# Patient Record
Sex: Male | Born: 1960 | Race: White | Hispanic: No | Marital: Married | Smoking: Never smoker
Health system: Southern US, Community
[De-identification: ages and names within clinical notes are randomized; demographics above are authoritative.]

## PROBLEM LIST (undated history)

## (undated) DIAGNOSIS — E785 Hyperlipidemia, unspecified: Secondary | ICD-10-CM

## (undated) DIAGNOSIS — I10 Essential (primary) hypertension: Secondary | ICD-10-CM

## (undated) DIAGNOSIS — K219 Gastro-esophageal reflux disease without esophagitis: Secondary | ICD-10-CM

## (undated) DIAGNOSIS — T7840XA Allergy, unspecified, initial encounter: Secondary | ICD-10-CM

## (undated) DIAGNOSIS — K429 Umbilical hernia without obstruction or gangrene: Secondary | ICD-10-CM

## (undated) HISTORY — DX: Gastro-esophageal reflux disease without esophagitis: K21.9

## (undated) HISTORY — DX: Essential (primary) hypertension: I10

## (undated) HISTORY — DX: Umbilical hernia without obstruction or gangrene: K42.9

## (undated) HISTORY — DX: Allergy, unspecified, initial encounter: T78.40XA

---

## 1999-12-12 ENCOUNTER — Encounter: Payer: Self-pay | Admitting: Family Medicine

## 1999-12-12 ENCOUNTER — Encounter: Admission: RE | Admit: 1999-12-12 | Discharge: 1999-12-12 | Payer: Self-pay | Admitting: Family Medicine

## 1999-12-22 ENCOUNTER — Encounter: Admission: RE | Admit: 1999-12-22 | Discharge: 2000-01-20 | Payer: Self-pay | Admitting: Family Medicine

## 2002-01-05 HISTORY — PX: HERNIA REPAIR: SHX51

## 2007-05-23 ENCOUNTER — Emergency Department (HOSPITAL_COMMUNITY): Admission: EM | Admit: 2007-05-23 | Discharge: 2007-05-23 | Payer: Self-pay | Admitting: Emergency Medicine

## 2011-03-12 LAB — URINALYSIS, ROUTINE W REFLEX MICROSCOPIC
Leukocytes, UA: NEGATIVE
Specific Gravity, Urine: 1.009
Urobilinogen, UA: 0.2
pH: 7.5

## 2011-03-12 LAB — I-STAT 8, (EC8 V) (CONVERTED LAB)
Acid-Base Excess: 1
BUN: 9
Glucose, Bld: 111 — ABNORMAL HIGH
HCT: 49
Hemoglobin: 16.7
Potassium: 3.6
TCO2: 27

## 2011-03-12 LAB — POCT CARDIAC MARKERS
Myoglobin, poc: 286
Operator id: 288831
Troponin i, poc: <0.05

## 2011-03-12 LAB — CBC
HCT: 43.8
Platelets: 248
RBC: 4.76
RDW: 12.6
WBC: 6.3

## 2011-03-12 LAB — SAMPLE TO BLOOD BANK

## 2011-03-12 LAB — URINE MICROSCOPIC-ADD ON

## 2011-03-12 LAB — POCT I-STAT CREATININE: Operator id: 288831

## 2011-03-12 LAB — DIFFERENTIAL
Lymphocytes Relative: 28
Lymphs Abs: 1.8
Neutro Abs: 4
Neutrophils Relative %: 64

## 2011-05-04 ENCOUNTER — Encounter (INDEPENDENT_AMBULATORY_CARE_PROVIDER_SITE_OTHER): Payer: Self-pay | Admitting: Surgery

## 2011-05-17 ENCOUNTER — Ambulatory Visit (INDEPENDENT_AMBULATORY_CARE_PROVIDER_SITE_OTHER): Payer: Self-pay | Admitting: Surgery

## 2011-06-09 ENCOUNTER — Ambulatory Visit (INDEPENDENT_AMBULATORY_CARE_PROVIDER_SITE_OTHER): Payer: Self-pay | Admitting: Surgery

## 2011-06-29 ENCOUNTER — Encounter (INDEPENDENT_AMBULATORY_CARE_PROVIDER_SITE_OTHER): Payer: Self-pay | Admitting: Surgery

## 2011-06-29 ENCOUNTER — Ambulatory Visit (INDEPENDENT_AMBULATORY_CARE_PROVIDER_SITE_OTHER): Payer: 59 | Admitting: Surgery

## 2011-06-29 VITALS — BP 128/88 | HR 76 | Temp 98.2°F | Resp 18 | Ht 70.0 in | Wt 175.2 lb

## 2011-06-29 DIAGNOSIS — K429 Umbilical hernia without obstruction or gangrene: Secondary | ICD-10-CM | POA: Insufficient documentation

## 2011-06-29 NOTE — Progress Notes (Signed)
Patient ID: Willie [name], male   DOB: 04-03-1961, 51 y.o.   MRN: [id number]  Chief Complaint  Patient presents with  . Other    Eval umbilical hernia    HPI Willie [name] is a 51 y.o. male.   HPIThis is a pleasant gentleman who is referred by Dr. Merla Riches for evaluation of a recurrent umbilical hernia. The patient had an umbilical hernia repair approximately 10 years ago without mesh. He has noticed a recurrence for about a year. He has a bulge which causes no discomfort. He has had no nausea or vomiting or structure symptoms. He is otherwise without complaints.  Past Medical History  Diagnosis Date  . Hypertension   . Umbilical hernia     Past Surgical History  Procedure Date  . Hernia repair 01/2002    Family History  Problem Relation Age of Onset  . Cancer Father     lung  . Pneumonia Father     Social History History  Substance Use Topics  . Smoking status: Never Smoker   . Smokeless tobacco: Never Used  . Alcohol Use: No    Allergies  Allergen Reactions  . Penicillins Hives, Nausea And Vomiting and Swelling    Current Outpatient Prescriptions  Medication Sig Dispense Refill  . amLODipine (NORVASC) 10 MG tablet Take 10 mg by mouth daily.          Review of Systems Review of Systems  Constitutional: Negative for fever, chills and unexpected weight change.  HENT: Negative for hearing loss, congestion, sore throat, trouble swallowing and voice change.   Eyes: Negative for visual disturbance.  Respiratory: Negative for cough and wheezing.   Cardiovascular: Negative for chest pain, palpitations and leg swelling.  Gastrointestinal: Negative for nausea, vomiting, abdominal pain, diarrhea, constipation, blood in stool, abdominal distention, anal bleeding and rectal pain.  Genitourinary: Negative for hematuria and difficulty urinating.  Musculoskeletal: Negative for arthralgias.  Skin: Negative for rash and wound.  Neurological: Negative for seizures, syncope,  weakness and headaches.  Hematological: Negative for adenopathy. Does not bruise/bleed easily.  Psychiatric/Behavioral: Negative for confusion.    Blood pressure 128/88, pulse 76, temperature 98.2 F (36.8 C), temperature source Temporal, resp. rate 18, height 5\' 10"  (1.778 m), weight 175 lb 3.2 oz (79.47 kg).  Physical Exam Physical Exam  Constitutional: He is oriented to person, place, and time. He appears well-developed and well-nourished. No distress.  HENT:  Head: Normocephalic and atraumatic.  Right Ear: External ear normal.  Left Ear: External ear normal.  Nose: Nose normal.  Mouth/Throat: Oropharynx is clear and moist. No oropharyngeal exudate.  Eyes: Conjunctivae and EOM are normal. Pupils are equal, round, and reactive to light. No scleral icterus.  Neck: Normal range of motion. Neck supple. No tracheal deviation present. No thyromegaly present.  Cardiovascular: Normal rate, regular rhythm, normal heart sounds and intact distal pulses.   No murmur heard. Pulmonary/Chest: Effort normal and breath sounds normal. No respiratory distress. He has no wheezes.  Abdominal: Soft. Bowel sounds are normal. He exhibits no distension. There is tenderness. There is no rebound.        He has a well-healed incision at the umbilicus with an easily reducible hernia  Musculoskeletal: Normal range of motion. He exhibits no edema and no tenderness.  Lymphadenopathy:    He has no cervical adenopathy.  Neurological: He is oriented to person, place, and time.  Skin: Skin is warm and dry. No rash noted. No erythema.  Psychiatric: His behavior is normal.  Judgment normal.    Data Reviewed I have the notes from Dr. Merla Riches which I have reviewed  Assessment    Recurrent umbilical hernia    Plan    Repair with mesh was recommended. I discussed both the laparoscopic and open techniques with the patient. I discussed the risk of surgery which includes, but is not limited to bleeding, infection,  need for removal of the mesh had become infected, injury to stranding structures, recurrence, etc. He understands and wishes to proceed with open repair. Likely of success is good. He will best be scheduled for open repair of a recurrent umbilical hernia with mesh       Willie [name] A 06/29/2011, 10:48 AM

## 2011-07-08 ENCOUNTER — Telehealth: Payer: Self-pay

## 2011-07-08 MED ORDER — AMLODIPINE BESYLATE 10 MG PO TABS: 10.0000 mg | ORAL_TABLET | Freq: Every day | ORAL | Status: DC

## 2011-07-08 NOTE — Telephone Encounter (Signed)
Pt would like refill on his bp medication he says he has not refills but it hasent been too long since he has been here

## 2011-07-08 NOTE — Telephone Encounter (Signed)
Pt needs rx refill for norvasc 5mg . Can we refill rx one time only and needs ov?  Last seen in 04/28/11 and labwork last done on 4/12.  Chart is at nurses station if needed.

## 2011-07-08 NOTE — Telephone Encounter (Signed)
Ok to fill x 1. Needs ov for more.  ryan

## 2011-07-09 NOTE — Telephone Encounter (Signed)
Called pt to verify strength of Rx and pharmacy. Called in Norvasc 10 mg. Pt agrees to f/up before next RF.

## 2011-08-27 ENCOUNTER — Encounter (HOSPITAL_COMMUNITY): Payer: Self-pay | Admitting: Pharmacy Technician

## 2011-08-29 ENCOUNTER — Ambulatory Visit (INDEPENDENT_AMBULATORY_CARE_PROVIDER_SITE_OTHER): Payer: 59 | Admitting: Family Medicine

## 2011-08-29 VITALS — BP 158/110 | HR 76 | Resp 16 | Ht 69.5 in | Wt 172.0 lb

## 2011-08-29 DIAGNOSIS — I10 Essential (primary) hypertension: Secondary | ICD-10-CM

## 2011-08-29 MED ORDER — AMLODIPINE BESYLATE 10 MG PO TABS: 10.0000 mg | ORAL_TABLET | Freq: Every day | ORAL | Status: DC

## 2011-08-29 NOTE — Progress Notes (Signed)
  Patient Name: Willie [name] Date of Birth: 1960/10/03 Medical Record Number: [id number] Gender: male Date of Encounter: 08/29/2011  History of Present Illness:  Willie [name] is a 51 y.o. very pleasant male patient who presents with the following:  Needs to have his BP medication refilled- he has been out for about one week and is to have an umbilical hernia repair this week.  We increased his norvasc from 5 to 10mg  last fall, and per his report his BP looked fine at his pre-op consultation.  He is to have labs done tomorrow.  He notes no CP or SOB.  He plays basketball for exercise without a problem  Patient Active Problem List  Diagnoses  . Recurrent umbilical hernia   Past Medical History  Diagnosis Date  . Hypertension   . Umbilical hernia    Past Surgical History  Procedure Date  . Hernia repair 01/2002   History  Substance Use Topics  . Smoking status: Never Smoker   . Smokeless tobacco: Never Used  . Alcohol Use: No   Family History  Problem Relation Age of Onset  . Cancer Father     lung  . Pneumonia Father    Allergies  Allergen Reactions  . Penicillins Hives, Nausea And Vomiting and Swelling    Medication list has been reviewed and updated.  Review of Systems: As per HPI- otherwise negative.  Physical Examination: Filed Vitals:   08/29/11 0853  BP: 158/110  Pulse: 76  Resp: 16  Height: 5' 9.5" (1.765 m)  Weight: 172 lb (78.019 kg)    Body mass index is 25.04 kg/(m^2).  GEN: WDWN, NAD, Non-toxic, A & O x 3 HEENT: Atraumatic, Normocephalic. Neck supple. No masses, No LAD. Ears and Nose: No external deformity. CV: RRR, No M/G/R. No JVD. No thrill. No extra heart sounds. PULM: CTA B, no wheezes, crackles, rhonchi. No retractions. No resp. distress. No accessory muscle use. EXTR: No c/c/e NEURO Normal gait.  PSYCH: Normally interactive. Conversant. Not depressed or anxious appearing.  Calm demeanor.    Assessment and Plan: 1. HTN (hypertension)   amLODipine (NORVASC) 10 MG tablet   Refilled BP meds and asked him to please send [place] a copy of his pre-op labs as he is too busy to have labs done today.  Follow- up for HTN maintenance as usual

## 2011-08-30 ENCOUNTER — Ambulatory Visit (HOSPITAL_COMMUNITY)
Admission: RE | Admit: 2011-08-30 | Discharge: 2011-08-30 | Disposition: A | Discharge status: Home / Self Care | Payer: 59 | Source: Ambulatory Visit | Attending: Surgery | Admitting: Surgery

## 2011-08-30 ENCOUNTER — Encounter (HOSPITAL_COMMUNITY)
Admission: RE | Admit: 2011-08-30 | Discharge: 2011-08-30 | Disposition: A | Discharge status: Home / Self Care | Payer: 59 | Source: Ambulatory Visit | Attending: Surgery | Admitting: Surgery

## 2011-08-30 ENCOUNTER — Encounter (HOSPITAL_COMMUNITY): Payer: Self-pay

## 2011-08-30 ENCOUNTER — Other Ambulatory Visit: Payer: Self-pay

## 2011-08-30 DIAGNOSIS — Z01818 Encounter for other preprocedural examination: Secondary | ICD-10-CM | POA: Insufficient documentation

## 2011-08-30 LAB — CBC
HCT: 46.4 % (ref 39.0–52.0)
Hemoglobin: 16 g/dL (ref 13.0–17.0)
MCH: 31.7 pg (ref 26.0–34.0)
Platelets: 269 10*3/uL (ref 150–400)
RBC: 5.05 MIL/uL (ref 4.22–5.81)
WBC: 6.2 10*3/uL (ref 4.0–10.5)

## 2011-08-30 LAB — BASIC METABOLIC PANEL
Creatinine, Ser: 1.2 mg/dL (ref 0.50–1.35)
Glucose, Bld: 118 mg/dL — ABNORMAL HIGH (ref 70–99)
Potassium: 4.7 mEq/L (ref 3.5–5.1)
Sodium: 139 mEq/L (ref 135–145)

## 2011-08-30 LAB — SURGICAL PCR SCREEN: MRSA, PCR: NEGATIVE

## 2011-08-30 NOTE — Pre-Procedure Instructions (Signed)
[address]  08/30/2011   Your procedure is scheduled on:  09/01/11  Report to [facility] at 630 AM.  Call this number if you have problems the morning of surgery: [phone]   Remember:   Do not eat food:After Midnight.  May have clear liquids: up to 4 Hours before arrival.  Clear liquids include soda, tea, black coffee, apple or grape juice, broth.  Take these medicines the morning of surgery with A SIP OF WATER: norvasc   Do not wear jewelry, make-up or nail polish.  Do not wear lotions, powders, or perfumes. You may wear deodorant.  Do not shave 48 hours prior to surgery.  Do not bring valuables to the hospital.  Contacts, dentures or bridgework may not be worn into surgery.  Leave suitcase in the car. After surgery it may be brought to your room.  For patients admitted to the hospital, checkout time is 11:00 AM the day of discharge.   Patients discharged the day of surgery will not be allowed to drive home.  Name and phone number of your driver: family  Special Instructions: CHG Shower Use Special Wash: 1/2 bottle night before surgery and 1/2 bottle morning of surgery.   Please read over the following fact sheets that you were given: Pain Booklet, Coughing and Deep Breathing, MRSA Information and Surgical Site Infection Prevention

## 2011-08-30 NOTE — Progress Notes (Signed)
Pt stated there have been no cardiac tests done by pcp.

## 2011-08-31 MED ORDER — CIPROFLOXACIN IN D5W 400 MG/200ML IV SOLN
400.0000 mg | INTRAVENOUS | Status: AC
  Administered 2011-09-01: 400 mg via INTRAVENOUS
  Filled 2011-08-31: qty 200

## 2011-08-31 NOTE — H&P (Signed)
HPI  Willie [name] is a 51 y.o. male.  HPIThis is a pleasant gentleman who is referred by Dr. Merla Riches for evaluation of a recurrent umbilical hernia. The patient had an umbilical hernia repair approximately 10 years ago without mesh. He has noticed a recurrence for about a year. He has a bulge which causes no discomfort. He has had no nausea or vomiting or structure symptoms. He is otherwise without complaints.  Past Medical History   Diagnosis  Date   .  Hypertension    .  Umbilical hernia     Past Surgical History   Procedure  Date   .  Hernia repair  01/2002    Family History   Problem  Relation  Age of Onset   .  Cancer  Father       lung    .  Pneumonia  Father     Social History  History   Substance Use Topics   .  Smoking status:  Never Smoker   .  Smokeless tobacco:  Never Used   .  Alcohol Use:  No    Allergies   Allergen  Reactions   .  Penicillins  Hives, Nausea And Vomiting and Swelling    Current Outpatient Prescriptions   Medication  Sig  Dispense  Refill   .  amLODipine (NORVASC) 10 MG tablet  Take 10 mg by mouth daily.      Review of Systems  Review of Systems  Constitutional: Negative for fever, chills and unexpected weight change.  HENT: Negative for hearing loss, congestion, sore throat, trouble swallowing and voice change.  Eyes: Negative for visual disturbance.  Respiratory: Negative for cough and wheezing.  Cardiovascular: Negative for chest pain, palpitations and leg swelling.  Gastrointestinal: Negative for nausea, vomiting, abdominal pain, diarrhea, constipation, blood in stool, abdominal distention, anal bleeding and rectal pain.  Genitourinary: Negative for hematuria and difficulty urinating.  Musculoskeletal: Negative for arthralgias.  Skin: Negative for rash and wound.  Neurological: Negative for seizures, syncope, weakness and headaches.  Hematological: Negative for adenopathy. Does not bruise/bleed easily.  Psychiatric/Behavioral:  Negative for confusion.   Blood pressure 128/88, pulse 76, temperature 98.2 F (36.8 C), temperature source Temporal, resp. rate 18, height 5\' 10"  (1.778 m), weight 175 lb 3.2 oz (79.47 kg).  Physical Exam  Physical Exam  Constitutional: He is oriented to person, place, and time. He appears well-developed and well-nourished. No distress.  HENT:  Head: Normocephalic and atraumatic.  Right Ear: External ear normal.  Left Ear: External ear normal.  Nose: Nose normal.  Mouth/Throat: Oropharynx is clear and moist. No oropharyngeal exudate.  Eyes: Conjunctivae and EOM are normal. Pupils are equal, round, and reactive to light. No scleral icterus.  Neck: Normal range of motion. Neck supple. No tracheal deviation present. No thyromegaly present.  Cardiovascular: Normal rate, regular rhythm, normal heart sounds and intact distal pulses.  No murmur heard.  Pulmonary/Chest: Effort normal and breath sounds normal. No respiratory distress. He has no wheezes.  Abdominal: Soft. Bowel sounds are normal. He exhibits no distension. There is tenderness. There is no rebound.  He has a well-healed incision at the umbilicus with an easily reducible hernia  Musculoskeletal: Normal range of motion. He exhibits no edema and no tenderness.  Lymphadenopathy:  He has no cervical adenopathy.  Neurological: He is oriented to person, place, and time.  Skin: Skin is warm and dry. No rash noted. No erythema.  Psychiatric: His behavior is normal. Judgment normal.  Data Reviewed  I have the notes from Dr. Merla Riches which I have reviewed  Assessment   Recurrent umbilical hernia   Plan   Repair with mesh was recommended. I discussed both the laparoscopic and open techniques with the patient. I discussed the risk of surgery which includes, but is not limited to bleeding, infection, need for removal of the mesh had become infected, injury to stranding structures, recurrence, etc. He understands and wishes to proceed  with open repair. Likely of success is good. He will best be scheduled for open repair of a recurrent umbilical hernia with mesh   Nabila Albarracin A

## 2011-09-01 ENCOUNTER — Encounter (HOSPITAL_COMMUNITY): Payer: Self-pay | Admitting: *Deleted

## 2011-09-01 ENCOUNTER — Encounter (HOSPITAL_COMMUNITY)
Admission: RE | Disposition: A | Discharge status: Home / Self Care | Payer: Self-pay | Source: Ambulatory Visit | Attending: Surgery

## 2011-09-01 ENCOUNTER — Ambulatory Visit (HOSPITAL_COMMUNITY)
Admission: RE | Admit: 2011-09-01 | Discharge: 2011-09-01 | Disposition: A | Discharge status: Home / Self Care | Payer: 59 | Source: Ambulatory Visit | Attending: Surgery | Admitting: Surgery

## 2011-09-01 ENCOUNTER — Ambulatory Visit (HOSPITAL_COMMUNITY): Payer: 59 | Admitting: *Deleted

## 2011-09-01 DIAGNOSIS — I10 Essential (primary) hypertension: Secondary | ICD-10-CM | POA: Insufficient documentation

## 2011-09-01 DIAGNOSIS — Z01810 Encounter for preprocedural cardiovascular examination: Secondary | ICD-10-CM | POA: Insufficient documentation

## 2011-09-01 DIAGNOSIS — K429 Umbilical hernia without obstruction or gangrene: Secondary | ICD-10-CM | POA: Insufficient documentation

## 2011-09-01 DIAGNOSIS — K432 Incisional hernia without obstruction or gangrene: Secondary | ICD-10-CM

## 2011-09-01 HISTORY — PX: UMBILICAL HERNIA REPAIR: SHX196

## 2011-09-01 SURGERY — REPAIR, HERNIA, UMBILICAL, ADULT
Anesthesia: General | Site: Abdomen | Wound class: Clean

## 2011-09-01 MED ORDER — HYDROCODONE-ACETAMINOPHEN 5-325 MG PO TABS: 1.0000 | ORAL_TABLET | ORAL | Status: AC | PRN

## 2011-09-01 MED ORDER — KETOROLAC TROMETHAMINE 60 MG/2ML IM SOLN
INTRAMUSCULAR | Status: DC | PRN
  Administered 2011-09-01: 30 mg via INTRAMUSCULAR

## 2011-09-01 MED ORDER — BUPIVACAINE-EPINEPHRINE PF 0.5-1:200000 % IJ SOLN
INTRAMUSCULAR | Status: DC | PRN
  Administered 2011-09-01: 16 mL

## 2011-09-01 MED ORDER — PROPOFOL 10 MG/ML IV EMUL
INTRAVENOUS | Status: DC | PRN
  Administered 2011-09-01: 200 mg via INTRAVENOUS

## 2011-09-01 MED ORDER — HYDROMORPHONE HCL PF 1 MG/ML IJ SOLN
0.2500 mg | INTRAMUSCULAR | Status: DC | PRN
  Administered 2011-09-01: 0.5 mg via INTRAVENOUS

## 2011-09-01 MED ORDER — FENTANYL CITRATE 0.05 MG/ML IJ SOLN
INTRAMUSCULAR | Status: DC | PRN
  Administered 2011-09-01 (×2): 50 ug via INTRAVENOUS
  Administered 2011-09-01: 25 ug via INTRAVENOUS

## 2011-09-01 MED ORDER — LACTATED RINGERS IV SOLN
INTRAVENOUS | Status: DC | PRN
  Administered 2011-09-01 (×2): via INTRAVENOUS

## 2011-09-01 MED ORDER — MORPHINE SULFATE 2 MG/ML IJ SOLN: 0.0500 mg/kg | INTRAMUSCULAR | Status: DC | PRN

## 2011-09-01 MED ORDER — BUPIVACAINE HCL 0.5 % IJ SOLN: INTRAMUSCULAR | Status: DC | PRN

## 2011-09-01 MED ORDER — LIDOCAINE HCL (CARDIAC) 20 MG/ML IV SOLN
INTRAVENOUS | Status: DC | PRN
  Administered 2011-09-01: 100 mg via INTRAVENOUS

## 2011-09-01 MED ORDER — ONDANSETRON HCL 4 MG/2ML IJ SOLN: 4.0000 mg | Freq: Once | INTRAMUSCULAR | Status: DC | PRN

## 2011-09-01 MED ORDER — 0.9 % SODIUM CHLORIDE (POUR BTL) OPTIME
TOPICAL | Status: DC | PRN
  Administered 2011-09-01: 1000 mL

## 2011-09-01 MED ORDER — ONDANSETRON HCL 4 MG/2ML IJ SOLN
INTRAMUSCULAR | Status: DC | PRN
  Administered 2011-09-01: 4 mg via INTRAVENOUS

## 2011-09-01 MED ORDER — MIDAZOLAM HCL 5 MG/5ML IJ SOLN
INTRAMUSCULAR | Status: DC | PRN
  Administered 2011-09-01: 2 mg via INTRAVENOUS

## 2011-09-01 SURGICAL SUPPLY — 38 items
APL SKNCLS STERI-STRIP NONHPOA (GAUZE/BANDAGES/DRESSINGS) ×1
BENZOIN TINCTURE PRP APPL 2/3 (GAUZE/BANDAGES/DRESSINGS) ×2 IMPLANT
BLADE SURG ROTATE 9660 (MISCELLANEOUS) ×1 IMPLANT
CANISTER SUCTION 2500CC (MISCELLANEOUS) ×1 IMPLANT
CHLORAPREP W/TINT 26ML (MISCELLANEOUS) ×2 IMPLANT
CLOTH BEACON ORANGE TIMEOUT ST (SAFETY) ×2 IMPLANT
COVER SURGICAL LIGHT HANDLE (MISCELLANEOUS) ×2 IMPLANT
DECANTER SPIKE VIAL GLASS SM (MISCELLANEOUS) IMPLANT
DRAPE PED LAPAROTOMY (DRAPES) ×2 IMPLANT
DRSG TEGADERM 4X4.75 (GAUZE/BANDAGES/DRESSINGS) ×2 IMPLANT
ELECT REM PT RETURN 9FT ADLT (ELECTROSURGICAL) ×2
ELECTRODE REM PT RTRN 9FT ADLT (ELECTROSURGICAL) ×1 IMPLANT
GAUZE SPONGE 2X2 8PLY STRL LF (GAUZE/BANDAGES/DRESSINGS) ×1 IMPLANT
GLOVE BIO SURGEON STRL SZ7 (GLOVE) ×1 IMPLANT
GLOVE BIOGEL PI IND STRL 7.0 (GLOVE) IMPLANT
GLOVE BIOGEL PI INDICATOR 7.0 (GLOVE) ×2
GLOVE SURG SIGNA 7.5 PF LTX (GLOVE) ×2 IMPLANT
GLOVE SURG SS PI 6.5 STRL IVOR (GLOVE) ×1 IMPLANT
GLOVE SURG SS PI 7.0 STRL IVOR (GLOVE) ×1 IMPLANT
GOWN PREVENTION PLUS XLARGE (GOWN DISPOSABLE) ×2 IMPLANT
GOWN STRL NON-REIN LRG LVL3 (GOWN DISPOSABLE) ×3 IMPLANT
KIT BASIN OR (CUSTOM PROCEDURE TRAY) ×2 IMPLANT
KIT ROOM TURNOVER OR (KITS) ×2 IMPLANT
MESH VENTRALEX ST 1-7/10 CRC S (Mesh General) ×1 IMPLANT
NDL HYPO 25GX1X1/2 BEV (NEEDLE) ×1 IMPLANT
NEEDLE HYPO 25GX1X1/2 BEV (NEEDLE) ×2 IMPLANT
NS IRRIG 1000ML POUR BTL (IV SOLUTION) ×2 IMPLANT
PACK GENERAL/GYN (CUSTOM PROCEDURE TRAY) ×2 IMPLANT
PAD ARMBOARD 7.5X6 YLW CONV (MISCELLANEOUS) ×2 IMPLANT
SPONGE GAUZE 2X2 STER 10/PKG (GAUZE/BANDAGES/DRESSINGS) ×1
STRIP CLOSURE SKIN 1/2X4 (GAUZE/BANDAGES/DRESSINGS) ×2 IMPLANT
SUT MNCRL AB 4-0 PS2 18 (SUTURE) ×2 IMPLANT
SUT NOVA NAB DX-16 0-1 5-0 T12 (SUTURE) ×3 IMPLANT
SUT VIC AB 3-0 SH 27 (SUTURE) ×2
SUT VIC AB 3-0 SH 27X BRD (SUTURE) ×1 IMPLANT
SYR CONTROL 10ML LL (SYRINGE) ×2 IMPLANT
TOWEL OR 17X24 6PK STRL BLUE (TOWEL DISPOSABLE) ×2 IMPLANT
TOWEL OR 17X26 10 PK STRL BLUE (TOWEL DISPOSABLE) ×2 IMPLANT

## 2011-09-01 NOTE — Op Note (Signed)
HERNIA REPAIR UMBILICAL ADULT, INSERTION OF MESH  Procedure Note  Willie [name] 09/01/2011   Pre-op Diagnosis: recurrent umbilical hernia     Post-op Diagnosis: same  Procedure(s): HERNIA REPAIR UMBILICAL ADULT INSERTION OF MESH (4.3cm round bard patch)  Surgeon(s): Shelly Rubenstein, MD  Anesthesia: General  Staff:  Eppie Gibson, RN - Circulator Assistant Leighton Parody - Scrub Person Gerre Pebbles Sipsis, RN - Circulator Eppie Gibson, RN - Scrub Person  Estimated Blood Loss: Minimal               Indications: This is [name] 51 year old gentleman who had an umbilical hernia repaired primarily approximately 10 years ago. He has now recurred. The decision has been made to proceed to the operating room for repair with mesh.  Procedure: The patient was brought to the operating room and identified as the correct patient. He was placed supine on the operating table and general anesthesia was induced. His abdomen was then prepped and draped in the usual sterile fashion. The skin of the lower edge of the umbilicus was anesthetized with Marcaine with epinephrine. I made an infraumbilical transverse incision with scalpel. After this down to the hernia which was easily identified. There was [name] small amount of omentum stuck to the fascial edges. I was able to free this up circumferentially with electrocautery and reduce all the omentum back into the abdominal cavity. I then brought [name] 4.3 cm Bard umbilical patch onto the field. I placed this through the fascial defect and then pulled up taut against the peritoneal surface with the straps. I then sewed the mesh in place with interrupted #1 Novafil sutures. I was then able to close the fascia over the top of the mesh with interrupted #1 Novafil sutures as well. Good closure and coverage of the fascial defect appeared to be achieved. I then anesthetized the fascia further with Marcaine. I imbricated the umbilical skin with 3-0 Vicryl  sutures. I then closed subcutaneous tissue with interrupted 3-0 Vicryl sutures and closed the skin with [name] running 4-0 Monocryl. Steri-Strips, gauze, and Tegaderm were then applied. The patient tolerated the procedure well. All counts were correct at the end of the procedure. The patient was then extubated in the operating room and taken in [name] stable condition to the recovery room.          Willie [name]   Date: 09/01/2011  Time: 9:11 AM

## 2011-09-01 NOTE — Discharge Instructions (Signed)
[facility] _______[facility]  UMBILICAL OR INGUINAL HERNIA REPAIR: POST OP INSTRUCTIONS  Always review your discharge instruction sheet given to you by the facility where your surgery was performed. IF YOU HAVE DISABILITY OR FAMILY LEAVE FORMS, YOU MUST BRING THEM TO THE OFFICE FOR PROCESSING.   DO NOT GIVE THEM TO YOUR DOCTOR.  1. A  prescription for pain medication may be given to you upon discharge.  Take your pain medication as prescribed, if needed.  If narcotic pain medicine is not needed, then you may take acetaminophen (Tylenol) or ibuprofen (Advil) as needed. 2. Take your usually prescribed medications unless otherwise directed. 3. If you need a refill on your pain medication, please contact your pharmacy.  They will contact our office to request authorization. Prescriptions will not be filled after 5 pm or on week-ends. 4. You should follow a light diet the first 24 hours after arrival home, such as soup and crackers, etc.  Be sure to include lots of fluids daily.  Resume your normal diet the day after surgery. 5. Most patients will experience some swelling and bruising around the umbilicus or in the groin and scrotum.  Ice packs and reclining will help.  Swelling and bruising can take several days to resolve.  6. It is common to experience some constipation if taking pain medication after surgery.  Increasing fluid intake and taking a stool softener (such as Colace) will usually help or prevent this problem from occurring.  A mild laxative (Milk of Magnesia or Miralax) should be taken according to package directions if there are no bowel movements after 48 hours. 7. Unless discharge instructions indicate otherwise, you may remove your bandages 24-48 hours after surgery, and you may shower at that time.  You may have steri-strips (small skin tapes) in place directly over the incision.  These strips should be left on the skin for 7-10 days.  If your surgeon used skin glue on the  incision, you may shower in 24 hours.  The glue will flake off over the next 2-3 weeks.  Any sutures or staples will be removed at the office during your follow-up visit. 8. ACTIVITIES:  You may resume regular (light) daily activities beginning the next day--such as daily self-care, walking, climbing stairs--gradually increasing activities as tolerated.  You may have sexual intercourse when it is comfortable.  Refrain from any heavy lifting or straining until approved by your doctor. a. You may drive when you are no longer taking prescription pain medication, you can comfortably wear a seatbelt, and you can safely maneuver your car and apply brakes. b. RETURN TO WORK:  __________________________________________________________ 9. You should see your doctor in the office for a follow-up appointment approximately 2-3 weeks after your surgery.  Make sure that you call for this appointment within a day or two after you arrive home to insure a convenient appointment time. 10. OTHER INSTRUCTIONS:  __________________________________________________________________________________________________________________________________________________________________________________________  WHEN TO CALL YOUR DOCTOR: 1. Fever over 101.0 2. Inability to urinate 3. Nausea and/or vomiting 4. Extreme swelling or bruising 5. Continued bleeding from incision. 6. Increased pain, redness, or drainage from the incision  The clinic staff is available to answer your questions during regular business hours.  Please don't hesitate to call and ask to speak to one of the nurses for clinical concerns.  If you have a medical emergency, go to the nearest emergency room or call 911.  A surgeon from [facility] is always on call at the hospital     [address], [address], [place], [place]  [address] ?  [address] [address], , [place]   [address] [phone] ? [phone] ? FAX [phone] Web site:  [email or web address]  

## 2011-09-01 NOTE — Anesthesia Postprocedure Evaluation (Signed)
  Anesthesia Post-op Note  Patient: Willie [name]  Procedure(s) Performed: Procedure(s) (LRB): HERNIA REPAIR UMBILICAL ADULT (N/A) INSERTION OF MESH (N/A)  Patient Location: PACU  Anesthesia Type: General  Level of Consciousness: awake, alert  and oriented  Airway and Oxygen Therapy: Patient Spontanous Breathing and Patient connected to nasal cannula oxygen  Post-op Pain: mild  Post-op Assessment: Post-op Vital signs reviewed, Patient's Cardiovascular Status Stable, Respiratory Function Stable, Patent Airway, No signs of Nausea or vomiting and Pain level controlled  Post-op Vital Signs: Reviewed and stable  Complications: No apparent anesthesia complications

## 2011-09-01 NOTE — Anesthesia Procedure Notes (Signed)
Procedure Name: LMA Insertion Date/Time: 09/01/2011 8:35 AM Performed by: Malachi Pro Pre-anesthesia Checklist: Patient identified, Emergency Drugs available, Suction available, Patient being monitored and Timeout performed Patient Re-evaluated:Patient Re-evaluated prior to inductionOxygen Delivery Method: Circle system utilized Preoxygenation: Pre-oxygenation with 100% oxygen Intubation Type: IV induction LMA: LMA inserted and LMA with gastric port inserted LMA Size: 5.0 Number of attempts: 2 Placement Confirmation: positive ETCO2 Tube secured with: Tape Dental Injury: Teeth and Oropharynx as per pre-operative assessment

## 2011-09-01 NOTE — Transfer of Care (Signed)
Immediate Anesthesia Transfer of Care Note  Patient: Willie [name]  Procedure(s) Performed: Procedure(s) (LRB): HERNIA REPAIR UMBILICAL ADULT (N/A) INSERTION OF MESH (N/A)  Patient Location: PACU  Anesthesia Type: General  Level of Consciousness: awake, alert  and patient cooperative  Airway & Oxygen Therapy: Patient Spontanous Breathing and Patient connected to face mask oxygen  Post-op Assessment: Report given to PACU RN, Post -op Vital signs reviewed and stable and Patient moving all extremities  Post vital signs: Reviewed and stable  Complications: No apparent anesthesia complications

## 2011-09-01 NOTE — Anesthesia Preprocedure Evaluation (Addendum)
Anesthesia Evaluation  Patient identified by MRN, date of birth, ID band Patient awake    Reviewed: Allergy & Precautions, H&P , Patient's Chart, lab work & pertinent test results  Airway Mallampati: II TM Distance: >3 FB Neck ROM: Full    Dental  (+) Dental Advisory Given and Teeth Intact   Pulmonary  breath sounds clear to auscultation        Cardiovascular hypertension, Pt. on medications Rhythm:Regular Rate:Normal     Neuro/Psych    GI/Hepatic   Endo/Other    Renal/GU      Musculoskeletal   Abdominal   Peds  Hematology   Anesthesia Other Findings   Reproductive/Obstetrics                         Anesthesia Physical Anesthesia Plan  ASA: II  Anesthesia Plan: General   Post-op Pain Management:    Induction: Intravenous  Airway Management Planned: Oral ETT  Additional Equipment:   Intra-op Plan:   Post-operative Plan: Extubation in OR  Informed Consent: I have reviewed the patients History and Physical, chart, labs and discussed the procedure including the risks, benefits and alternatives for the proposed anesthesia with the patient or authorized representative who has indicated his/her understanding and acceptance.   Dental advisory given  Plan Discussed with: Anesthesiologist  Anesthesia Plan Comments:         Anesthesia Quick Evaluation

## 2011-09-01 NOTE — Interval H&P Note (Signed)
History and Physical Interval Note: he has had no change in his history or exam  09/01/2011 7:47 AM  Willie [name]  has presented today for surgery, with the diagnosis of umbilical hernia  The various methods of treatment have been discussed with the patient and family. After consideration of risks, benefits and other options for treatment, the patient has consented to  Procedure(s) (LRB): HERNIA REPAIR UMBILICAL ADULT (N/A) INSERTION OF MESH (N/A) as a surgical intervention .  The patients' history has been reviewed, patient examined, no change in status, stable for surgery.  I have reviewed the patients' chart and labs.  Questions were answered to the patient's satisfaction.     Cadi Rhinehart A

## 2011-09-01 NOTE — Preoperative (Signed)
Beta Blockers   Reason not to administer Beta Blockers:Not Applicable 

## 2011-09-02 ENCOUNTER — Encounter (HOSPITAL_COMMUNITY): Payer: Self-pay | Admitting: Surgery

## 2011-09-02 ENCOUNTER — Telehealth (INDEPENDENT_AMBULATORY_CARE_PROVIDER_SITE_OTHER): Payer: Self-pay | Admitting: General Surgery

## 2011-09-02 NOTE — Telephone Encounter (Signed)
Pt calling in following umbilical hernia repair.  He wonders when he can return to work?  With or without restrictions? He will have a name and FAX number to provide with callback; I implied he would not hear from [place] until next week about RTW.  He also asked about something to help with BM.  Suggested Miralax.

## 2011-09-16 ENCOUNTER — Encounter (INDEPENDENT_AMBULATORY_CARE_PROVIDER_SITE_OTHER): Payer: Self-pay | Admitting: Surgery

## 2011-09-16 ENCOUNTER — Ambulatory Visit (INDEPENDENT_AMBULATORY_CARE_PROVIDER_SITE_OTHER): Payer: 59 | Admitting: Surgery

## 2011-09-16 VITALS — BP 128/88 | HR 96 | Temp 98.6°F | Ht 70.0 in | Wt 172.4 lb

## 2011-09-16 DIAGNOSIS — Z09 Encounter for follow-up examination after completed treatment for conditions other than malignant neoplasm: Secondary | ICD-10-CM

## 2011-09-16 NOTE — Progress Notes (Signed)
Subjective:     Patient ID: Willie [name], male   DOB: 1960/10/10, 51 y.o.   MRN: [id number]  HPI  He is here for his first postop visit status post repair of a recurrent hernia at umbilicus with mesh. He is doing well has no complaints Review of Systems     Objective:   Physical Exam His incision is well-healed. There is no evidence of infection. There is no evidence of recurrence    Assessment:     Patient status post umbilical hernia repair doing well    Plan:     He may return to normal activity. I will see him back as needed

## 2012-09-11 ENCOUNTER — Other Ambulatory Visit: Payer: Self-pay | Admitting: Family Medicine

## 2013-03-09 ENCOUNTER — Ambulatory Visit: Payer: Self-pay | Admitting: Internal Medicine

## 2013-03-09 VITALS — BP 142/92 | HR 92 | Temp 98.0°F | Resp 20 | Ht 69.75 in | Wt 163.6 lb

## 2013-03-09 DIAGNOSIS — R7309 Other abnormal glucose: Secondary | ICD-10-CM

## 2013-03-09 DIAGNOSIS — I10 Essential (primary) hypertension: Secondary | ICD-10-CM

## 2013-03-09 MED ORDER — AMLODIPINE BESYLATE 10 MG PO TABS: 10.0000 mg | ORAL_TABLET | Freq: Every day | ORAL | Status: DC

## 2013-03-09 NOTE — Progress Notes (Signed)
  Subjective:    Patient ID: Willie [name], male    DOB: 11-19-1960, 52 y.o.   MRN: [id number]  HPI Patient presents today for refill of his BP medication. He has not had any medication for at least a month. Has been really busy. Mother passed away from CHF. 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  Lost his job. Not happy in previous job, glad to be moving on.  No SOB, no CP, no cough.  Occasional acid indigestion, usually brought on by fried foods and garlic. Takes tums for relief.  Plays pick up basket ball a couple of times a week and walks with wife every evening for at least 45 mins.  Emotionally doing well, no sadness, no anger, no depression. Sleeping well.  Review of Systems     Objective:   Physical Exam  Constitutional: He is oriented to person, place, and time. He appears well-developed and well-nourished.  Eyes: Conjunctivae are normal.  Neck: Normal range of motion. Neck supple.  Cardiovascular: Normal rate and regular rhythm.   Pulmonary/Chest: Effort normal and breath sounds normal.  Musculoskeletal: He exhibits no edema.  Neurological: He is alert and oriented to person, place, and time. He has normal reflexes.  Skin: Skin is warm and dry.  Psychiatric: He has a normal mood and affect. His behavior is normal. Judgment and thought content normal.   Results for orders placed in visit on 03/09/13  POCT GLYCOSYLATED HEMOGLOBIN (HGB A1C)      Result Value Range   Hemoglobin A1C 5.5          Assessment & Plan:  Elevated random blood glucose level - Plan: POCT glycosylated hemoglobin (Hb A1C)  HTN (hypertension) - Plan: amLODipine (NORVASC) 10 MG tablet  Discussed HbA1C result  Provided information about the [facility] care clinic while patient uninsured.   DG/RPD

## 2013-03-09 NOTE — Patient Instructions (Signed)
While without health insurance, can try other resources, such as the [facility] clinic for screening and physical exam.

## 2014-06-20 ENCOUNTER — Ambulatory Visit (INDEPENDENT_AMBULATORY_CARE_PROVIDER_SITE_OTHER): Payer: Managed Care, Other (non HMO) | Admitting: Family Medicine

## 2014-06-20 ENCOUNTER — Encounter: Payer: Self-pay | Admitting: Family Medicine

## 2014-06-20 VITALS — BP 160/110 | HR 98 | Temp 98.6°F | Resp 18 | Ht 69.75 in | Wt 169.0 lb

## 2014-06-20 DIAGNOSIS — I10 Essential (primary) hypertension: Secondary | ICD-10-CM

## 2014-06-20 LAB — LIPID PANEL
CHOL/HDL RATIO: 4.9 ratio
Cholesterol: 170 mg/dL (ref 0–200)
HDL: 35 mg/dL — ABNORMAL LOW (ref >39)
LDL Cholesterol: 66 mg/dL (ref 0–99)
Triglycerides: 343 mg/dL — ABNORMAL HIGH (ref <150)
VLDL: 69 mg/dL — ABNORMAL HIGH (ref 0–40)

## 2014-06-20 LAB — COMPLETE METABOLIC PANEL WITH GFR
ALT: 50 U/L (ref 0–53)
AST: 33 U/L (ref 0–37)
Albumin: 4 g/dL (ref 3.5–5.2)
Alkaline Phosphatase: 81 U/L (ref 39–117)
BILIRUBIN TOTAL: 0.5 mg/dL (ref 0.2–1.2)
BUN: 9 mg/dL (ref 6–23)
CO2: 29 mEq/L (ref 19–32)
CREATININE: 1.15 mg/dL (ref 0.50–1.35)
Calcium: 9.5 mg/dL (ref 8.4–10.5)
Chloride: 100 mEq/L (ref 96–112)
GFR, EST AFRICAN AMERICAN: 84 mL/min
GFR, EST NON AFRICAN AMERICAN: 73 mL/min
Glucose, Bld: 98 mg/dL (ref 70–99)
POTASSIUM: 4 meq/L (ref 3.5–5.3)
Sodium: 138 mEq/L (ref 135–145)
Total Protein: 7.5 g/dL (ref 6.0–8.3)

## 2014-06-20 MED ORDER — HYDROCHLOROTHIAZIDE 12.5 MG PO TABS: 12.5000 mg | ORAL_TABLET | Freq: Every day | ORAL | Status: DC

## 2014-06-20 MED ORDER — AMLODIPINE BESYLATE 10 MG PO TABS: 10.0000 mg | ORAL_TABLET | Freq: Every day | ORAL | Status: DC

## 2014-06-20 NOTE — Patient Instructions (Signed)
Take the amlodipine 1 daily  Monitor your blood pressure. If diastolic pressure is continued to rise 54-year-old above didn't get the hydrochlorothiazide prescription filled and take one each morning.  Plan to return in 3-4 months for a follow-up visit. Sooner if problems.

## 2014-06-20 NOTE — Progress Notes (Signed)
Subjective: 54 year old man who has a history of hypertension. He had been on amlodipine 10 mg daily. He let the prescription lapse. Even while he was taking it regularly, he reports that the diastolic blood pressure often ran a little over 90. He has been healthy otherwise. No chest pains or palpitations or excessive shortness of breath. Mild headache from a respiratory infection. Married. 5 children. Horticulturist, commercialGraphic artist. Meredeth IdeWalks a half hour or more daily with his wife.  Objective: Healthy-appearing man in no major distress. TMs normal. Eyes PERRLA. Throat clear. Neck supple without nodes. Chest clear. Heart regular without murmurs. Abdomen soft. Tiny recurrent umbilical hernia.  Assessment: Hypertension, unsatisfactory control  Plan: Amlodipine 10 mg daily  He is to monitor his blood pressure at home. If he gets a pattern of running the diastolic borderline or high he will go ahead and and the prescription for hydrochlorothiazide 12.5 mg daily. Then have him plan to return in about April or May for follow-up visit. Thank you

## 2014-11-12 ENCOUNTER — Ambulatory Visit (INDEPENDENT_AMBULATORY_CARE_PROVIDER_SITE_OTHER): Payer: Managed Care, Other (non HMO) | Admitting: Internal Medicine

## 2014-11-12 VITALS — BP 142/90 | HR 106 | Temp 98.9°F | Resp 18 | Ht 71.0 in | Wt 168.0 lb

## 2014-11-12 DIAGNOSIS — J0100 Acute maxillary sinusitis, unspecified: Secondary | ICD-10-CM

## 2014-11-12 MED ORDER — AZITHROMYCIN 250 MG PO TABS: ORAL_TABLET | ORAL | Status: DC

## 2014-11-12 NOTE — Progress Notes (Addendum)
   Subjective:    Patient ID: Renard MatterGeorge M Rampey, male    DOB: 01/15/1961, 54 y.o.   MRN: [id number] This chart was scribed for Ellamae Siaobert Doolittle, MD by Jolene Provostobert Halas, Medical Scribe. This patient was seen in Room 3 and the patient's care was started a 3:11 PM.  Chief Complaint  Patient presents with  . head congestion    since friday   . Nasal Congestion  . Fatigue    HPI HPI Comments: Renard MatterGeorge M Rought is a 54 y.o. male who [facility] complaining of sinus pressure for the last four days. Pt also states he has developed a sore throat today. Pt endorses associated fatigue and HA. Pt denies fever, cough, SOB or rhinorrhea. Pt states he has mild intermittent seasonal allergies. Pt states when he wakes up in the morning his sinus are completely clogged and do not unclog until he takes a shower. Pt states when he bends over he has increased pressure in his head. Pt is allergic to penicillin.   Pt states his BP has been running around 80/130.     Review of Systems  Constitutional: Negative for fever and chills.  HENT: Positive for congestion and sinus pressure.   Respiratory: Negative for cough and shortness of breath.   Neurological: Positive for headaches.       Objective:   Physical Exam  Constitutional: He is oriented to person, place, and time. He appears well-developed and well-nourished. No distress.  HENT:  Head: Normocephalic and atraumatic.  Right Ear: External ear normal.  Left Ear: External ear normal.  Mouth/Throat: Oropharynx is clear and moist.  Purulent d/c  Tender max areas  Eyes: Pupils are equal, round, and reactive to light.  Neck: Neck supple.  Cardiovascular: Normal rate.   Pulmonary/Chest: Effort normal. No respiratory distress.  Musculoskeletal: Normal range of motion.  Lymphadenopathy:    He has no cervical adenopathy.  Neurological: He is alert and oriented to person, place, and time. Coordination normal.  Skin: Skin is warm and dry. He is not diaphoretic.   Psychiatric: He has a normal mood and affect. His behavior is normal.  Nursing note and vitals reviewed.      Assessment & Plan:  Acute maxillary sinusitis Meds ordered this encounter  Medications  . azithromycin (ZITHROMAX) 250 MG tablet    Sig: As packaged    Dispense:  6 tablet    Refill:  0   Sudafed 12 hour in the morning for 3 days    I have completed the patient encounter in its entirety as documented by the scribe, with editing by me where necessary. Robert P. Merla Richesoolittle, M.D.

## 2014-11-16 ENCOUNTER — Ambulatory Visit (INDEPENDENT_AMBULATORY_CARE_PROVIDER_SITE_OTHER): Payer: Managed Care, Other (non HMO) | Admitting: Family Medicine

## 2014-11-16 ENCOUNTER — Telehealth: Payer: Self-pay

## 2014-11-16 VITALS — BP 122/70 | HR 111 | Temp 98.7°F | Ht 71.0 in | Wt 163.8 lb

## 2014-11-16 DIAGNOSIS — J0100 Acute maxillary sinusitis, unspecified: Secondary | ICD-10-CM

## 2014-11-16 MED ORDER — LEVOFLOXACIN 500 MG PO TABS: 500.0000 mg | ORAL_TABLET | Freq: Every day | ORAL | Status: DC

## 2014-11-16 NOTE — Patient Instructions (Signed)

## 2014-11-16 NOTE — Progress Notes (Signed)
° °  Subjective:    Patient ID: Willie [name], male    DOB: May 21, 1961, 54 y.o.   MRN: [id number] This chart was scribed for Elvina Sidle, MD by Littie Deeds, Medical Scribe. This patient was seen in Room 14 and the patient's care was started at 11:25 AM.   HPI HPI Comments: Willie [name] is a 54 y.o. male who presents to the [facility] for a follow-up. Patient was seen here 4 days ago for sinusitis. Symptoms include fatigue, generalized arthralgias, scratchy throat, nasal congestion, loss of appetite, and night sweats a few nights. His symptoms have worsened overall since he was last seen. He stayed home from work the following day, but returned to work the day after because his symptoms had improved some; however they started to worsen again by the day after that (yesterday). He finished his course of Z-pack today. Patient denies cough. He also denies smoking. He has allergies to penicillins.  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  Note from 11/12/14 by Dr. Merla Riches: Willie [name] is a 54 y.o. male who presents to [facility] complaining of sinus pressure for the last four days. Pt also states he has developed a sore throat today. Pt endorses associated fatigue and HA. Pt denies fever, cough, SOB or rhinorrhea. Pt states he has mild intermittent seasonal allergies. Pt states when he wakes up in the morning his sinus are completely clogged and do not unclog until he takes a shower. Pt states when he bends over he has increased pressure in his head. Pt is allergic to penicillin.  Pt states his BP has been running around 80/130.  Review of Systems  Constitutional: Positive for diaphoresis, appetite change and fatigue.  HENT: Positive for congestion and sore throat.   Respiratory: Negative for cough.   Musculoskeletal: Positive for arthralgias.       Objective:   Physical Exam CONSTITUTIONAL: Well developed/well nourished HEAD: Normocephalic/atraumatic EYES: EOM/PERRL ENMT: Mucous  membranes moist, posterior pharyngeal yellow drainage.  Nose mildly inflamed with no abnormal growths.  Some mucopurulent drainage NECK: supple no meningeal signs SPINE: entire spine nontender CV: S1/S2 noted, no murmurs/rubs/gallops noted LUNGS: Rales in right base ABDOMEN: soft, nontender, no rebound or guarding GU: no cva tenderness NEURO: Pt is awake/alert, moves all extremitiesx4 EXTREMITIES: pulses normal, full ROM SKIN: warm, color normal PSYCH: no abnormalities of mood noted      Assessment & Plan:   This chart was scribed in my presence and reviewed by me personally.    ICD-9-CM ICD-10-CM   1. Acute maxillary sinusitis, recurrence not specified 461.0 J01.00 levofloxacin (LEVAQUIN) 500 MG tablet     Signed, Elvina Sidle, MD

## 2015-05-05 ENCOUNTER — Encounter: Payer: Self-pay | Admitting: Internal Medicine

## 2015-05-09 ENCOUNTER — Ambulatory Visit (INDEPENDENT_AMBULATORY_CARE_PROVIDER_SITE_OTHER): Payer: Managed Care, Other (non HMO)

## 2015-05-09 ENCOUNTER — Ambulatory Visit (INDEPENDENT_AMBULATORY_CARE_PROVIDER_SITE_OTHER): Payer: Managed Care, Other (non HMO) | Admitting: Physician Assistant

## 2015-05-09 VITALS — BP 132/80 | HR 107 | Temp 97.9°F | Resp 18 | Ht 71.0 in | Wt 159.8 lb

## 2015-05-09 DIAGNOSIS — K2100 Gastro-esophageal reflux disease with esophagitis, without bleeding: Secondary | ICD-10-CM

## 2015-05-09 DIAGNOSIS — M545 Low back pain, unspecified: Secondary | ICD-10-CM

## 2015-05-09 LAB — POCT CBC
GRANULOCYTE PERCENT: 65.1 % (ref 37–80)
HEMATOCRIT: 47.1 % (ref 43.5–53.7)
Hemoglobin: 16.3 g/dL (ref 14.1–18.1)
Lymph, poc: 2 (ref 0.6–3.4)
MCH: 31.3 pg — AB (ref 27–31.2)
MCHC: 34.7 g/dL (ref 31.8–35.4)
MCV: 90.2 fL (ref 80–97)
MID (CBC): 0.4 (ref 0–0.9)
MPV: 8.6 fL (ref 0–99.8)
POC Granulocyte: 4.6 (ref 2–6.9)
POC LYMPH PERCENT: 28.8 %L (ref 10–50)
POC MID %: 6.1 % (ref 0–12)
Platelet Count, POC: 247 10*3/uL (ref 142–424)
RBC: 5.22 M/uL (ref 4.69–6.13)
RDW, POC: 13.7 %
WBC: 7.1 10*3/uL (ref 4.6–10.2)

## 2015-05-09 LAB — COMPLETE METABOLIC PANEL WITH GFR
ALBUMIN: 4.5 g/dL (ref 3.6–5.1)
ALK PHOS: 83 U/L (ref 40–115)
ALT: 15 U/L (ref 9–46)
AST: 16 U/L (ref 10–35)
BUN: 12 mg/dL (ref 7–25)
CALCIUM: 9.5 mg/dL (ref 8.6–10.3)
CO2: 29 mmol/L (ref 20–31)
Chloride: 101 mmol/L (ref 98–110)
Creat: 1.08 mg/dL (ref 0.70–1.33)
GFR, EST NON AFRICAN AMERICAN: 78 mL/min (ref >=60)
GFR, Est African American: >89 mL/min (ref >=60)
Glucose, Bld: 99 mg/dL (ref 65–99)
POTASSIUM: 4.2 mmol/L (ref 3.5–5.3)
Sodium: 138 mmol/L (ref 135–146)
TOTAL PROTEIN: 7.6 g/dL (ref 6.1–8.1)
Total Bilirubin: 0.7 mg/dL (ref 0.2–1.2)

## 2015-05-09 LAB — POCT URINALYSIS DIP (MANUAL ENTRY)
BILIRUBIN UA: NEGATIVE
BILIRUBIN UA: NEGATIVE
Blood, UA: NEGATIVE
GLUCOSE UA: NEGATIVE
LEUKOCYTES UA: NEGATIVE
NITRITE UA: NEGATIVE
Protein Ur, POC: 30 — AB
Spec Grav, UA: 1.015
Urobilinogen, UA: 1
pH, UA: 7

## 2015-05-09 LAB — TSH: TSH: 1.433 u[IU]/mL (ref 0.350–4.500)

## 2015-05-09 MED ORDER — RANITIDINE HCL 150 MG PO TABS: 150.0000 mg | ORAL_TABLET | Freq: Every day | ORAL | Status: DC

## 2015-05-09 MED ORDER — TRAMADOL-ACETAMINOPHEN 37.5-325 MG PO TABS: 1.0000 | ORAL_TABLET | Freq: Four times a day (QID) | ORAL | Status: DC | PRN

## 2015-05-09 MED ORDER — PANTOPRAZOLE SODIUM 40 MG PO TBEC: 40.0000 mg | DELAYED_RELEASE_TABLET | Freq: Every day | ORAL | Status: DC

## 2015-05-09 MED ORDER — CYCLOBENZAPRINE HCL 10 MG PO TABS: 10.0000 mg | ORAL_TABLET | Freq: Three times a day (TID) | ORAL | Status: DC | PRN

## 2015-05-09 NOTE — Progress Notes (Signed)
05/09/2015 12:10 PM   DOB: [birth year] / MRN: [id number]  SUBJECTIVE:  Willie [name] is a 54 y.o. male presenting for the evaluation of gradually worsening "achy" left sided low back pain that started 1 years ago. Associated symptoms include tingling, leg pain, and he denies new numbness, new weakness, incontinence, pelvic pain.Treatments tried thus far include Ibuprofen with mild relief. He denies fever, nausea, dysuria, frequency and urgency.   Paitent complains of heartburn. This has been associated with abdominal bloating, belching, fullness after meals, heartburn, midespigastric pain and nausea.  He denies hematemesis and melena. Symptoms have been present for 4 years. He denies dysphagia.  He has not lost weight. He denies melena, hematochezia, hematemesis, and coffee ground emesis. Medical therapy in the past has included none.    He is allergic to penicillins.   He  has a past medical history of Umbilical hernia and Hypertension.    He  reports that he has never smoked. He has never used smokeless tobacco. He reports that he does not drink alcohol or use illicit drugs. He  reports that he currently engages in sexual activity. The patient  has past surgical history that includes Hernia repair (01/2002) and Umbilical hernia repair (09/01/2011).  His family history includes Cancer in his father and maternal grandmother; Heart disease in his paternal grandfather; Hypertension in his mother; Pneumonia in his father.  Review of Systems  Constitutional: Negative for fever.  Respiratory: Negative for cough and shortness of breath.   Cardiovascular: Negative for chest pain.  Gastrointestinal: Negative for vomiting, diarrhea and constipation.  Genitourinary: Negative for dysuria, urgency, frequency, hematuria and flank pain.  Musculoskeletal: Negative for myalgias.  Skin: Negative for rash.  Neurological: Negative for dizziness and headaches.    Problem list and medications reviewed and  updated by myself where necessary, and exist elsewhere in the encounter.   OBJECTIVE:  BP 132/80 mmHg  Pulse 107  Temp(Src) 97.9 F (36.6 C) (Oral)  Resp 18  Ht  (1.803 m)  Wt 159 lb 12.8 oz (72.485 kg)  BMI 22.30 kg/m2  SpO2 99% CrCl cannot be calculated (Patient has no serum creatinine result on file.).  Physical Exam  Constitutional: He is oriented to person, place, and time. He appears well-developed and well-nourished. No distress.  Cardiovascular: Normal rate and normal heart sounds.   Pulmonary/Chest: Effort normal and breath sounds normal.  Abdominal: Soft. Bowel sounds are normal. He exhibits no distension and no mass. There is tenderness (mild in RUQ). There is no rebound and no guarding.  Neurological: He is alert and oriented to person, place, and time. He has normal reflexes. He displays no tremor and normal reflexes. No cranial nerve deficit or sensory deficit. He exhibits normal muscle tone. Coordination and gait normal. GCS eye subscore is 4. GCS verbal subscore is 5. GCS motor subscore is 6.  Skin: Skin is warm and dry. He is not diaphoretic.  Psychiatric: He has a normal mood and affect.    Results for orders placed or performed in visit on 05/09/15 (from the past 48 hour(s))  POCT CBC     Status: Abnormal   Collection Time: 05/09/15 11:27 AM  Result Value Ref Range   WBC 7.1 4.6 - 10.2 K/uL   Lymph, poc 2.0 0.6 - 3.4   POC LYMPH PERCENT 28.8 10 - 50 %L   MID (cbc) 0.4 0 - 0.9   POC MID % 6.1 0 - 12 %M   POC Granulocyte 4.6 2 -  6.9   Granulocyte percent 65.1 37 - 80 %G   RBC 5.22 4.69 - 6.13 M/uL   Hemoglobin 16.3 14.1 - 18.1 g/dL   HCT, POC 16.147.1 09.643.5 - 53.7 %   MCV 90.2 80 - 97 fL   MCH, POC 31.3 (A) 27 - 31.2 pg   MCHC 34.7 31.8 - 35.4 g/dL   RDW, POC 04.513.7 %   Platelet Count, POC 247 142 - 424 K/uL   MPV 8.6 0 - 99.8 fL  POCT urinalysis dipstick     Status: Abnormal   Collection Time: 05/09/15 11:34 AM  Result Value Ref Range   Color, UA yellow  yellow   Clarity, UA clear clear   Glucose, UA negative negative   Bilirubin, UA negative negative   Ketones, POC UA negative negative   Spec Grav, UA 1.015    Blood, UA negative negative   pH, UA 7.0    Protein Ur, POC =30 (A) negative   Urobilinogen, UA 1.0    Nitrite, UA Negative Negative   Leukocytes, UA Negative Negative   [facility] reading (PRIMARY) by  Dr. Neva SeatGreene: Question of sacralization of L5-S1 versus DJD.  Mild anterolisthesis of L5 on L4.    ASSESSMENT AND PLAN  Greggory StallionGeorge was seen today for abdominal cramping and back pain.  Diagnoses and all orders for this visit:  Gastroesophageal reflux disease with esophagitis: His CBC is reassuring but does not exclude ulcer.  Will await H. Pylori testing before prescribing anti-inflammatories.  Advised that if his H. Pylori is positive and we can eradicate his symptoms then we can hold on the scope.  If it is negative he will need to be evaluated by GI.   -     POCT CBC -     TSH -     COMPLETE METABOLIC PANEL WITH GFR -     POCT urinalysis dipstick -     H. pylori breath test -     ranitidine (ZANTAC) 150 MG tablet; Take 1 tablet (150 mg total) by mouth at bedtime. -     pantoprazole (PROTONIX) 40 MG tablet; Take 1 tablet (40 mg total) by mouth daily. Take thirty minutes before the first meal daily.  Left-sided low back pain without sciatica -     DG Lumbar Spine Complete; Future -     traMADol-acetaminophen (ULTRACET) 37.5-325 MG tablet; Take 1 tablet by mouth every 6 (six) hours as needed. -     cyclobenzaprine (FLEXERIL) 10 MG tablet; Take 1 tablet (10 mg total) by mouth 3 (three) times daily as needed for muscle spasms.    The patient was advised to call or return to clinic if he does not see an improvement in symptoms or to seek the care of the closest emergency department if he worsens with the above plan.   Deliah BostonMichael Clark, MHS, PA-C [facility] 05/09/2015 12:10 PM

## 2015-05-12 ENCOUNTER — Other Ambulatory Visit: Payer: Self-pay | Admitting: Physician Assistant

## 2015-05-12 DIAGNOSIS — A048 Other specified bacterial intestinal infections: Secondary | ICD-10-CM

## 2015-05-12 LAB — H. PYLORI BREATH TEST: H. pylori Breath Test: DETECTED — AB

## 2015-05-12 MED ORDER — CLARITHROMYCIN 500 MG PO TABS: 500.0000 mg | ORAL_TABLET | Freq: Two times a day (BID) | ORAL | Status: DC

## 2015-05-12 MED ORDER — METRONIDAZOLE 500 MG PO TABS: 500.0000 mg | ORAL_TABLET | Freq: Two times a day (BID) | ORAL | Status: DC

## 2015-05-14 ENCOUNTER — Telehealth: Payer: Self-pay

## 2015-05-14 NOTE — Telephone Encounter (Signed)
Called patient back, informed him of his lab results and new medication. Patient understood.

## 2015-05-14 NOTE — Telephone Encounter (Signed)
Pt received a call from his pharmacy stating there was more meds to pick up and he would like to know what they are for -he states he saw Deliah Bostonmichael clark  Best number [phone]

## 2015-08-12 ENCOUNTER — Ambulatory Visit (INDEPENDENT_AMBULATORY_CARE_PROVIDER_SITE_OTHER): Payer: Managed Care, Other (non HMO) | Admitting: Family Medicine

## 2015-08-12 VITALS — BP 160/102 | HR 82 | Temp 98.0°F | Resp 16 | Ht 70.0 in | Wt 160.2 lb

## 2015-08-12 DIAGNOSIS — E781 Pure hyperglyceridemia: Secondary | ICD-10-CM

## 2015-08-12 DIAGNOSIS — I10 Essential (primary) hypertension: Secondary | ICD-10-CM

## 2015-08-12 LAB — COMPLETE METABOLIC PANEL WITH GFR
ALT: 21 U/L (ref 9–46)
AST: 17 U/L (ref 10–35)
Albumin: 4 g/dL (ref 3.6–5.1)
Alkaline Phosphatase: 71 U/L (ref 40–115)
BUN: 12 mg/dL (ref 7–25)
CALCIUM: 9.1 mg/dL (ref 8.6–10.3)
CHLORIDE: 103 mmol/L (ref 98–110)
CO2: 31 mmol/L (ref 20–31)
Creat: 1.17 mg/dL (ref 0.70–1.33)
GFR, EST AFRICAN AMERICAN: 82 mL/min (ref >=60)
GFR, EST NON AFRICAN AMERICAN: 71 mL/min (ref >=60)
Glucose, Bld: 114 mg/dL — ABNORMAL HIGH (ref 65–99)
POTASSIUM: 4.4 mmol/L (ref 3.5–5.3)
Sodium: 141 mmol/L (ref 135–146)
Total Bilirubin: 0.4 mg/dL (ref 0.2–1.2)
Total Protein: 6.8 g/dL (ref 6.1–8.1)

## 2015-08-12 LAB — LIPID PANEL
CHOL/HDL RATIO: 3.9 ratio (ref <=5.0)
CHOLESTEROL: 185 mg/dL (ref 125–200)
HDL: 47 mg/dL (ref >=40)
LDL Cholesterol: 100 mg/dL (ref <130)
TRIGLYCERIDES: 191 mg/dL — AB (ref <150)
VLDL: 38 mg/dL — AB (ref <30)

## 2015-08-12 MED ORDER — AMLODIPINE BESYLATE 10 MG PO TABS: 10.0000 mg | ORAL_TABLET | Freq: Every day | ORAL | Status: DC

## 2015-08-12 MED ORDER — HYDROCHLOROTHIAZIDE 12.5 MG PO TABS: 12.5000 mg | ORAL_TABLET | Freq: Every day | ORAL | Status: DC

## 2015-08-12 NOTE — Progress Notes (Signed)
Subjective:  This chart was scribed for Meredith Staggers MD, by Veverly Fells, at [facility].  This patient was seen in room 2 and the patient's care was started at 8:45 AM.   Chief Complaint  Patient presents with  . Medication Refill     Patient ID: Willie [name], male    DOB: October 04, 1960, 55 y.o.   MRN: [id number]  HPI  HPI Comments: Willie [name] is a 55 y.o. male who presents to the [facility] for medication refill. Patients PCP is Dr. Merla Riches and will start looking for a new provider. Patient works in [employer].   Hypertension:  He is on Norvasc 10 mg qd and hydrochlorothiazide 10 mg.  Last office visit in December for GERD and lower back pain.   Hypertension discussed January 2016.  At that time he has been out of medication for 1 month.   -----  Patient notes that he ran out of his medication a few weeks ago. Patient states that his blood pressure usually runs around 132/88 which was what is measured during his last visit.  He has not had anything to eat or drink this morning. He denies any chest pain, difficulty breathing, lightheadedness/dizziness, swelling, dark or tarry stools, abdominal pain.   Lab Results  Component Value Date   CREATININE 1.08 05/09/2015   Lab Results  Component Value Date   CHOL 170 06/20/2014   HDL 35* 06/20/2014   LDLCALC 66 06/20/2014   TRIG 343* 06/20/2014   CHOLHDL 4.9 06/20/2014    Hypertriglyceridemia:  He was advised to have this repeat while fasting in April of last year.   ----- Patient does not drink alcohol or smoke.      Patient Active Problem List   Diagnosis Date Noted  . Recurrent umbilical hernia 06/29/2011   Past Medical History  Diagnosis Date  . Umbilical hernia   . Hypertension     dr Merla Riches   Past Surgical History  Procedure Laterality Date  . Hernia repair  01/2002  . Umbilical hernia repair  09/01/2011    Procedure: HERNIA REPAIR UMBILICAL ADULT;  Surgeon:  Shelly Rubenstein, MD;  Location: [facility] OR;  Service: General;  Laterality: N/A;   Allergies  Allergen Reactions  . Penicillins Hives, Nausea And Vomiting and Swelling   Prior to Admission medications   Medication Sig Start Date End Date Taking? Authorizing Provider  amLODipine (NORVASC) 10 MG tablet Take 1 tablet (10 mg total) by mouth daily. 06/20/14  Yes Peyton Najjar, MD  hydrochlorothiazide (HYDRODIURIL) 12.5 MG tablet Take 1 tablet (12.5 mg total) by mouth daily. 06/20/14  Yes Peyton Najjar, MD  ranitidine (ZANTAC) 150 MG tablet Take 1 tablet (150 mg total) by mouth at bedtime. 05/09/15  Yes Ofilia Neas, PA-C  cyclobenzaprine (FLEXERIL) 10 MG tablet Take 1 tablet (10 mg total) by mouth 3 (three) times daily as needed for muscle spasms. Patient not taking: Reported on 08/12/2015 05/09/15   Ofilia Neas, PA-C  pantoprazole (PROTONIX) 40 MG tablet Take 1 tablet (40 mg total) by mouth daily. Take thirty minutes before the first meal daily. Patient not taking: Reported on 08/12/2015 05/09/15   Ofilia Neas, PA-C   Social History   Social History  . Marital Status: Married    Spouse Name: N/A  . Number of Children: N/A  . Years of Education: N/A   Occupational History  . Not on file.   Social  History Main Topics  . Smoking status: Never Smoker   . Smokeless tobacco: Never Used  . Alcohol Use: No  . Drug Use: No  . Sexual Activity: Yes   Other Topics Concern  . Not on file   Social History Narrative     Review of Systems  Constitutional: Negative for fatigue and unexpected weight change.  Eyes: Negative for visual disturbance.  Respiratory: Negative for cough, chest tightness and shortness of breath.   Cardiovascular: Negative for chest pain, palpitations and leg swelling.  Gastrointestinal: Negative for abdominal pain and blood in stool.  Neurological: Negative for dizziness, light-headedness and headaches.       Objective:   Physical Exam  Constitutional: He is  oriented to person, place, and time. He appears well-developed and well-nourished.  HENT:  Head: Normocephalic and atraumatic.  Eyes: EOM are normal. Pupils are equal, round, and reactive to light.  Neck: No JVD present. Carotid bruit is not present.  Cardiovascular: Normal rate, regular rhythm and normal heart sounds.   No murmur heard. Pulmonary/Chest: Effort normal and breath sounds normal. He has no rales.  Musculoskeletal: He exhibits no edema.  Neurological: He is alert and oriented to person, place, and time.  Skin: Skin is warm and dry.  Psychiatric: He has a normal mood and affect.  Vitals reviewed.   Filed Vitals:   08/12/15 0821 08/12/15 0825  BP: 160/100 160/102  Pulse: 82   Temp: 98 F (36.7 C)   TempSrc: Oral   Resp: 16   Height: 5\' 10"  (1.778 m)   Weight: 160 lb 3.2 oz (72.666 kg)   SpO2: 99%          Assessment & Plan:   Willie [name] is a 55 y.o. male Essential hypertension - Plan: amLODipine (NORVASC) 10 MG tablet, hydrochlorothiazide (HYDRODIURIL) 12.5 MG tablet, COMPLETE METABOLIC PANEL WITH GFR, Lipid panel  -Uncontrolled in office, but off of medication. When he is taking medications, appears to be at correct doses. Reviewed prior visit, and BP controlled then. Continue same doses of Norvasc and HCTZ, 6 months refills, labs pending.  -Plan on follow-up with Deliah BostonMichael Clark, PA-C for physical in next 6 months.  Hypertriglyceridemia - Plan: COMPLETE METABOLIC PANEL WITH GFR, Lipid panel  -Fasting today. Repeat lipid panel pending.  Meds ordered this encounter  Medications  . amLODipine (NORVASC) 10 MG tablet    Sig: Take 1 tablet (10 mg total) by mouth daily.    Dispense:  90 tablet    Refill:  3  . hydrochlorothiazide (HYDRODIURIL) 12.5 MG tablet    Sig: Take 1 tablet (12.5 mg total) by mouth daily.    Dispense:  90 tablet    Refill:  1   Patient Instructions  You should receive a call or letter about your lab results within the next week to 10  days.  No change in medications for now. Follow-up with Deliah BostonMichael Clark, PA-C for a physical in the next 6 months.      I personally performed the services described in this documentation, which was scribed in my presence. The recorded information has been reviewed and considered, and addended by me as needed.

## 2015-08-12 NOTE — Patient Instructions (Addendum)
You should receive a call or letter about your lab results within the next week to 10 days.  No change in medications for now. Follow-up with Deliah BostonMichael Clark, PA-C for a physical in the next 6 months.  Because you received labwork today, you will receive an invoice from [facility]. Please contact [facility] at [phone] with questions or concerns regarding your invoice. Our billing staff will not be able to assist you with those questions.  You will be contacted with the lab results as soon as they are available. The fastest way to get your results is to activate your My Chart account. Instructions are located on the last page of this paperwork. If you have not heard from us regarding the results in 2 weeks, please contact this office.

## 2015-12-22 ENCOUNTER — Telehealth: Payer: Self-pay

## 2015-12-22 DIAGNOSIS — I10 Essential (primary) hypertension: Secondary | ICD-10-CM

## 2015-12-22 NOTE — Telephone Encounter (Signed)
Patient is calling to request a refill for amlodipine. Patient's new insurance is [employer] and they require a  90 day supply for prescriptions, not 30. Pharmacy is [facility] [address]

## 2015-12-23 MED ORDER — AMLODIPINE BESYLATE 10 MG PO TABS: 10.0000 mg | ORAL_TABLET | Freq: Every day | ORAL | Status: DC

## 2015-12-23 NOTE — Telephone Encounter (Signed)
Rx sent 

## 2016-08-30 IMAGING — CR DG LUMBAR SPINE COMPLETE 4+V
5 series · 5 of 5 positions shown · non-contrast
Comparison: None.

CLINICAL DATA: Left-sided low back pain without sciatica

EXAM:
LUMBAR SPINE - COMPLETE 4+ VIEW

[AP]
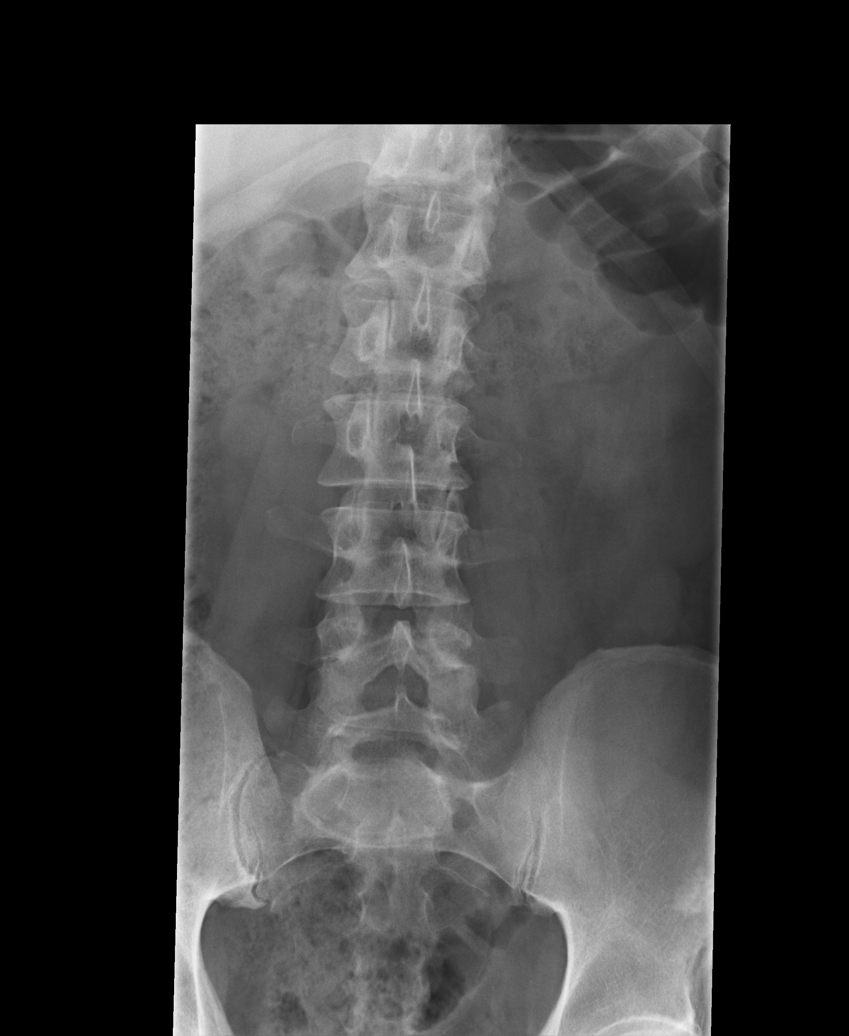

[rpo]
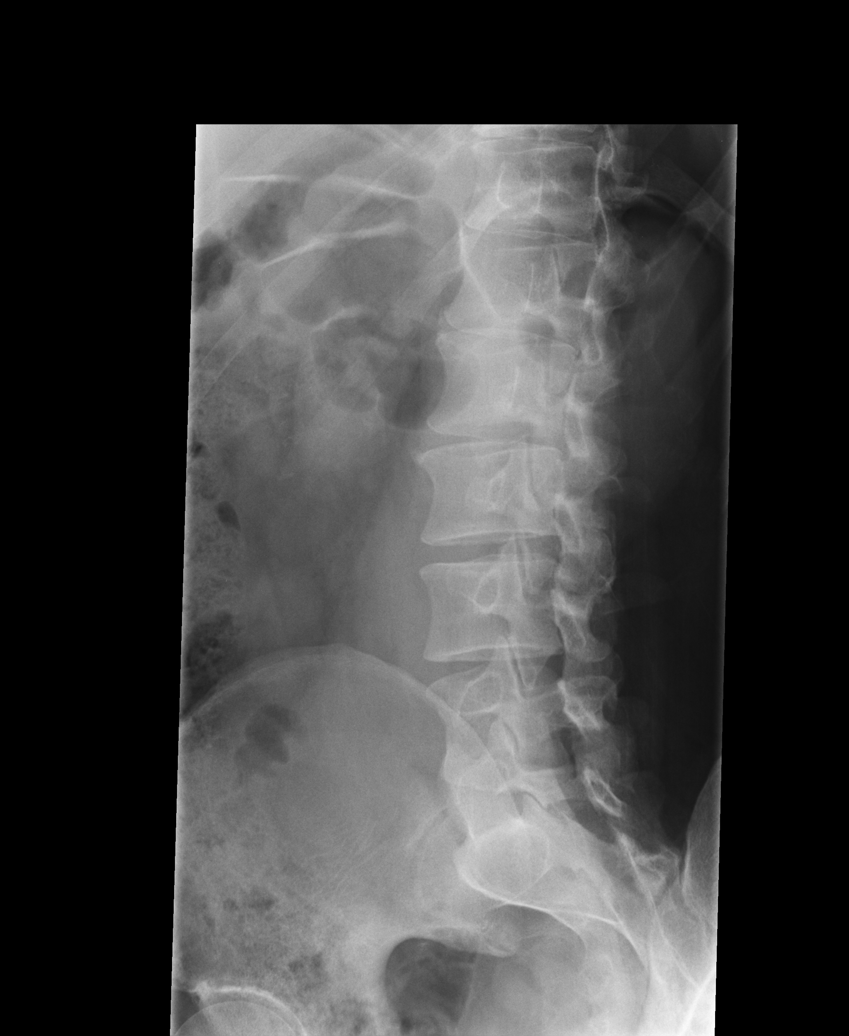

[lpo]
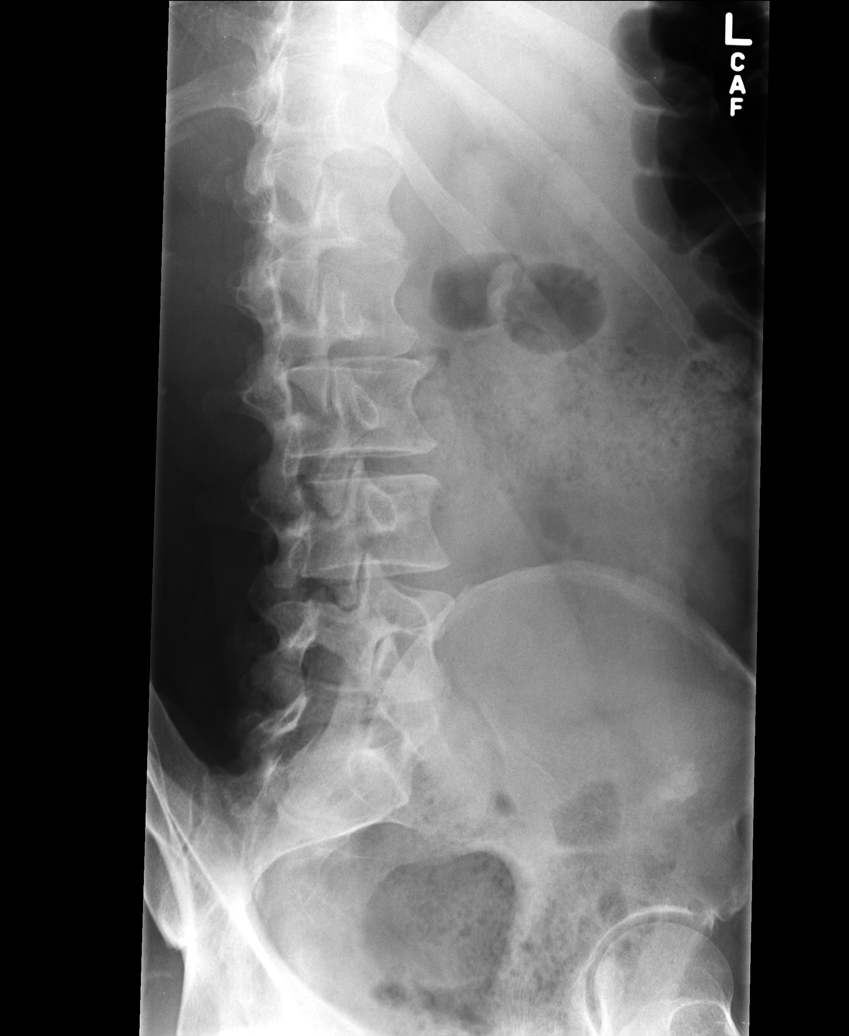

[l5 s1]
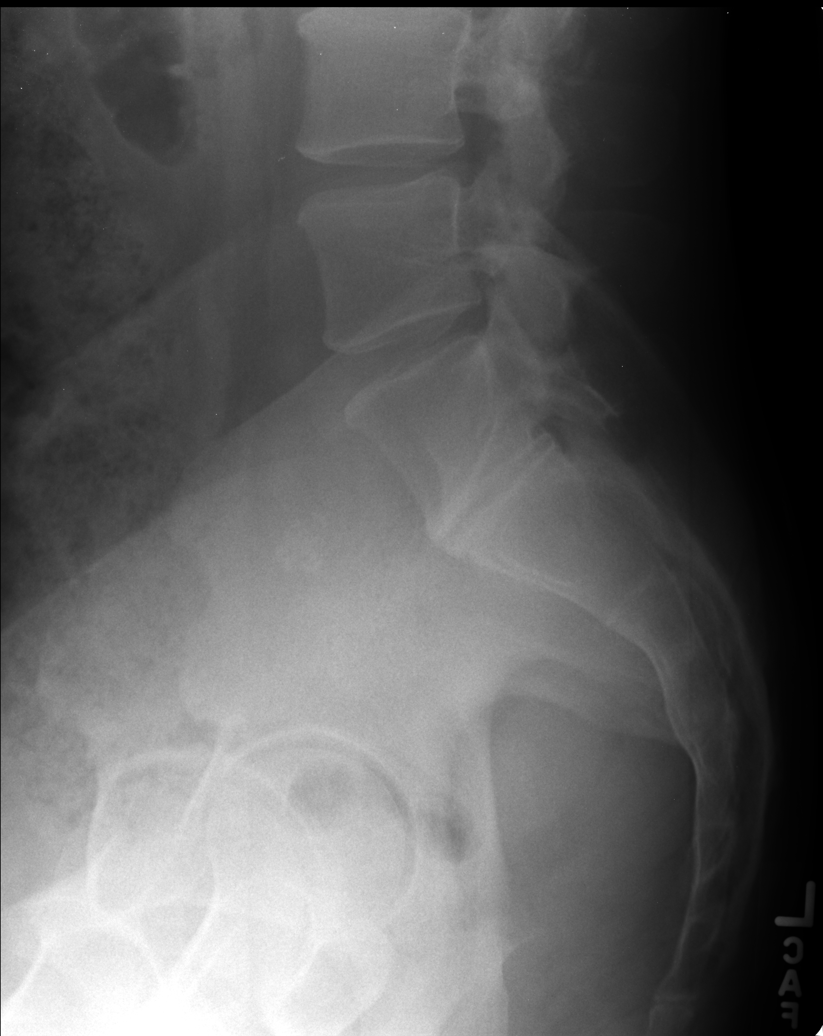

[lateral]
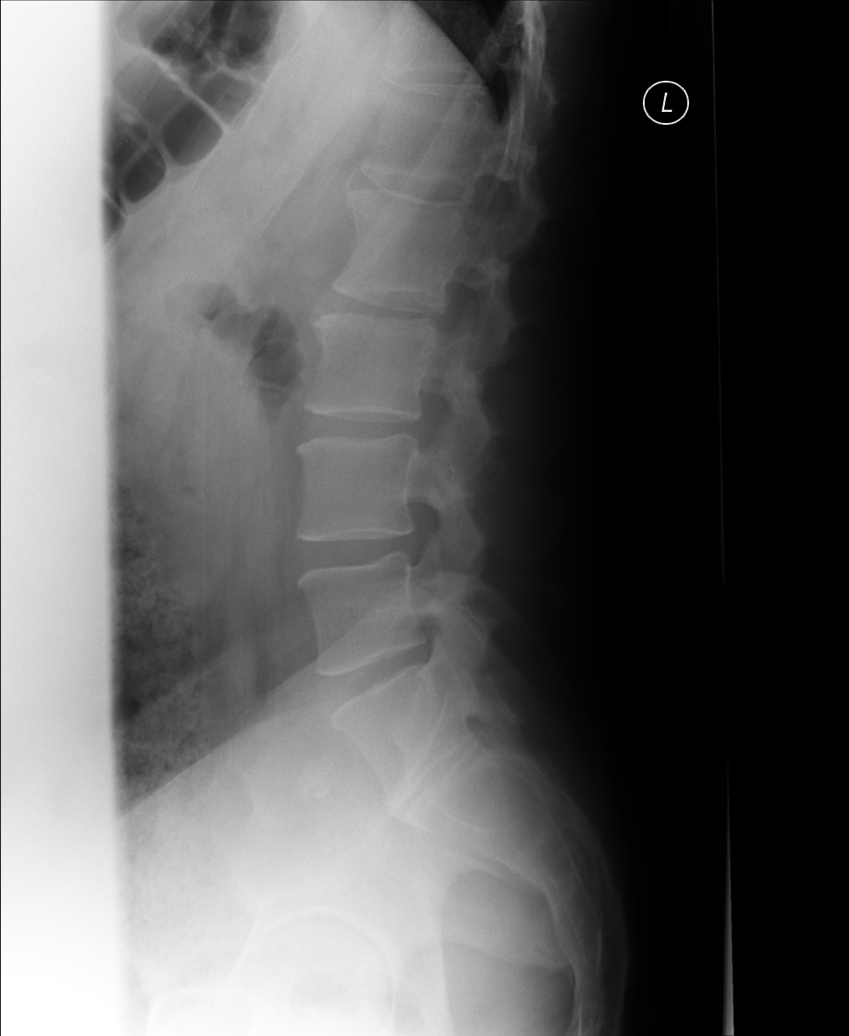

[5 of 5 positions shown; findings below may reference images not displayed]

FINDINGS: There is no evidence of lumbar spine fracture. Alignment is normal.
Intervertebral disc spaces are maintained.
IMPRESSION: Negative.

## 2016-11-17 NOTE — Telephone Encounter (Signed)
error 

## 2016-11-22 ENCOUNTER — Telehealth: Payer: Self-pay | Admitting: Family Medicine

## 2016-11-22 ENCOUNTER — Other Ambulatory Visit: Payer: Self-pay | Admitting: Emergency Medicine

## 2016-11-22 DIAGNOSIS — I10 Essential (primary) hypertension: Secondary | ICD-10-CM

## 2016-11-22 MED ORDER — AMLODIPINE BESYLATE 10 MG PO TABS: 10.0000 mg | ORAL_TABLET | Freq: Every day | ORAL | 0 refills | Status: DC

## 2016-11-22 NOTE — Telephone Encounter (Signed)
Pt is out of his blood pressure meds amlodipine and has scheduled an appt with dr Neva Seatgreene on Friday and would like to know if he can get enough of a refill til then Best number [phone]

## 2016-11-22 NOTE — Telephone Encounter (Signed)
Refill prescribed for #10, no refills until seen by Dr. Neva SeatGreene 11/26/16

## 2016-11-23 NOTE — Telephone Encounter (Signed)
Pt notified via vm Rx called in and we will see him on 6/22, if he has any questions or concerns to call us.

## 2016-11-26 ENCOUNTER — Encounter: Payer: Self-pay | Admitting: Family Medicine

## 2016-11-26 ENCOUNTER — Ambulatory Visit (INDEPENDENT_AMBULATORY_CARE_PROVIDER_SITE_OTHER): Payer: Managed Care, Other (non HMO) | Admitting: Family Medicine

## 2016-11-26 VITALS — BP 122/78 | HR 75 | Temp 97.0°F | Resp 18 | Ht 70.87 in | Wt 166.0 lb

## 2016-11-26 DIAGNOSIS — I10 Essential (primary) hypertension: Secondary | ICD-10-CM | POA: Diagnosis not present

## 2016-11-26 DIAGNOSIS — E781 Pure hyperglyceridemia: Secondary | ICD-10-CM

## 2016-11-26 DIAGNOSIS — R739 Hyperglycemia, unspecified: Secondary | ICD-10-CM | POA: Diagnosis not present

## 2016-11-26 MED ORDER — AMLODIPINE BESYLATE 10 MG PO TABS: 10.0000 mg | ORAL_TABLET | Freq: Every day | ORAL | 1 refills | Status: DC

## 2016-11-26 NOTE — Patient Instructions (Addendum)
No change in medications for now. I refilled amlodipine for the next 6 months. We will let you know about the lab work. Schedule a physical within the next 6 months, let me know if you have questions in the meantime.  IF you received an x-ray today, you will receive an invoice from [facility]. Please contact [facility] at [phone] with questions or concerns regarding your invoice.   IF you received labwork today, you will receive an invoice from [facility]. Please contact [facility] at [phone] with questions or concerns regarding your invoice.   Our billing staff will not be able to assist you with questions regarding bills from these companies.  You will be contacted with the lab results as soon as they are available. The fastest way to get your results is to activate your My Chart account. Instructions are located on the last page of this paperwork. If you have not heard from us regarding the results in 2 weeks, please contact this office.

## 2016-11-26 NOTE — Progress Notes (Signed)
Subjective:  By signing my name below, I, Stann Ore, attest that this documentation has been prepared under the direction and in the presence of Meredith Staggers, MD. Electronically Signed: Stann Ore, Scribe. 11/26/2016 , 2:28 PM .  Patient was seen in Room 11 .   Patient ID: Willie [name], male    DOB: 10/30/1960, 56 y.o.   MRN: [id number] Chief Complaint  Patient presents with  . Hypertension    follow-up/refill   HPI Willie [name] is a 56 y.o. male  Here for follow up on HTN. His last meal was last night.   HTN He takes norvasc 10mg  QD. He states he's doing well on this medication. He has some light swelling in his lower legs towards the end of the day.   Lab Results  Component Value Date   CREATININE 1.17 08/12/2015   Hyperglycemia His glucose was 114 in testing in March 2017.   Family history of diabetes in his aunt.   Hyperlipidemia His triglycerides were elevated in March 2017.   Lab Results  Component Value Date   CHOL 185 08/12/2015   HDL 47 08/12/2015   LDLCALC 100 08/12/2015   TRIG 191 (H) 08/12/2015   CHOLHDL 3.9 08/12/2015   Wt Readings from Last 3 Encounters:  11/26/16 166 lb (75.3 kg)  08/12/15 160 lb 3.2 oz (72.7 kg)  05/09/15 159 lb 12.8 oz (72.5 kg)    Recommended 6 month follow up; no new medications at that time.   Patient Active Problem List   Diagnosis Date Noted  . 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 Hypertension    dr Merla Riches  . Umbilical hernia    Past Surgical History:  Procedure Laterality Date  . HERNIA REPAIR  01/2002  . UMBILICAL HERNIA REPAIR  09/01/2011   Procedure: HERNIA REPAIR UMBILICAL ADULT;  Surgeon: Shelly Rubenstein, MD;  Location: [facility] OR;  Service: General;  Laterality: N/A;   Allergies  Allergen Reactions  . Penicillins Hives, Nausea And Vomiting and Swelling   Prior to Admission medications   Medication Sig Start Date End Date Taking? Authorizing Provider   amLODipine (NORVASC) 10 MG tablet Take 1 tablet (10 mg total) by mouth daily. Additional refills will be given during office visit 11/22/16   Shade Flood, MD   Social History   Social History  . Marital status: Married    Spouse name: N/A  . Number of children: N/A  . Years of education: N/A   Occupational History  . Not on file.   Social History Main Topics  . Smoking status: Never Smoker  . Smokeless tobacco: Never Used  . Alcohol use No  . Drug use: No  . Sexual activity: Yes   Other Topics Concern  . Not on file   Social History Narrative  . No narrative on file   Review of Systems  Constitutional: Negative for fatigue and unexpected weight change.  Eyes: Negative for visual disturbance.  Respiratory: Negative for cough, chest tightness and shortness of breath.   Cardiovascular: Negative for chest pain, palpitations and leg swelling.  Gastrointestinal: Negative for abdominal pain and blood in stool.  Neurological: Negative for dizziness, light-headedness and headaches.       Objective:   Physical Exam  Constitutional: He is oriented to person, place, and time. He appears well-developed and well-nourished.  HENT:  Head: Normocephalic and atraumatic.  Eyes: EOM are normal. Pupils are equal, round, and reactive  to light.  Neck: No JVD present. Carotid bruit is not present.  Cardiovascular: Normal rate, regular rhythm and normal heart sounds.   No murmur heard. Pulmonary/Chest: Effort normal and breath sounds normal. He has no rales.  Musculoskeletal: He exhibits no edema.  Neurological: He is alert and oriented to person, place, and time.  Skin: Skin is warm and dry.  Psychiatric: He has a normal mood and affect.  Vitals reviewed.   Vitals:   11/26/16 1344  BP: 122/78  Pulse: 75  Resp: 18  Temp: 97 F (36.1 C)  TempSrc: Oral  SpO2: 98%  Weight: 166 lb (75.3 kg)  Height: 5' 10.87" (1.8 m)      Assessment & Plan:   Renard MatterGeorge M [name] is a 56 y.o.  male Hyperglycemia - Plan: Hemoglobin A1c  -Prior elevation in 2017. Check A1c, CMP.  Essential hypertension - Plan: amLODipine (NORVASC) 10 MG tablet  -Stable, continued Norvasc 10 mg daily, CMP pending  Hypertriglyceridemia - Plan: Comprehensive metabolic panel, Lipid panel  -Slight elevation previously, recheck labs. Weight has gone up, can check 10 year ASCVD risk based on labs  Plan on physical in 6 months.  Meds ordered this encounter  Medications  . amLODipine (NORVASC) 10 MG tablet    Sig: Take 1 tablet (10 mg total) by mouth daily.    Dispense:  90 tablet    Refill:  1   Patient Instructions   No change in medications for now. I refilled amlodipine for the next 6 months. We will let you know about the lab work. Schedule a physical within the next 6 months, let me know if you have questions in the meantime.  IF you received an x-ray today, you will receive an invoice from [facility]. Please contact [facility] at [phone] with questions or concerns regarding your invoice.   IF you received labwork today, you will receive an invoice from [facility]. Please contact [facility] at [phone] with questions or concerns regarding your invoice.   Our billing staff will not be able to assist you with questions regarding bills from these companies.  You will be contacted with the lab results as soon as they are available. The fastest way to get your results is to activate your My Chart account. Instructions are located on the last page of this paperwork. If you have not heard from us regarding the results in 2 weeks, please contact this office.       I personally performed the services described in this documentation, which was scribed in my presence. The recorded information has been reviewed and considered for accuracy and completeness, addended by me as needed, and agree with information above.  Signed,   Meredith StaggersJeffrey Jakoby Melendrez, MD Primary Care at [facility].  11/28/16 10:05 PM

## 2016-11-27 LAB — COMPREHENSIVE METABOLIC PANEL
ALK PHOS: 88 IU/L (ref 39–117)
ALT: 20 IU/L (ref 0–44)
AST: 19 IU/L (ref 0–40)
Albumin/Globulin Ratio: 1.4 (ref 1.2–2.2)
Albumin: 4.2 g/dL (ref 3.5–5.5)
BILIRUBIN TOTAL: 0.4 mg/dL (ref 0.0–1.2)
BUN/Creatinine Ratio: 12 (ref 9–20)
BUN: 12 mg/dL (ref 6–24)
CHLORIDE: 103 mmol/L (ref 96–106)
CO2: 25 mmol/L (ref 20–29)
Calcium: 9.3 mg/dL (ref 8.7–10.2)
Creatinine, Ser: 1.03 mg/dL (ref 0.76–1.27)
GFR calc Af Amer: 95 mL/min/{1.73_m2} (ref >59)
GFR calc non Af Amer: 82 mL/min/{1.73_m2} (ref >59)
GLUCOSE: 104 mg/dL — AB (ref 65–99)
Globulin, Total: 3 g/dL (ref 1.5–4.5)
Potassium: 4.3 mmol/L (ref 3.5–5.2)
Sodium: 142 mmol/L (ref 134–144)
Total Protein: 7.2 g/dL (ref 6.0–8.5)

## 2016-11-27 LAB — LIPID PANEL
CHOLESTEROL TOTAL: 182 mg/dL (ref 100–199)
Chol/HDL Ratio: 4.2 ratio (ref 0.0–5.0)
HDL: 43 mg/dL (ref >39)
LDL Calculated: 94 mg/dL (ref 0–99)
TRIGLYCERIDES: 227 mg/dL — AB (ref 0–149)
VLDL CHOLESTEROL CAL: 45 mg/dL — AB (ref 5–40)

## 2016-11-27 LAB — HEMOGLOBIN A1C
Est. average glucose Bld gHb Est-mCnc: 114 mg/dL
HEMOGLOBIN A1C: 5.6 % (ref 4.8–5.6)

## 2017-04-07 ENCOUNTER — Ambulatory Visit (INDEPENDENT_AMBULATORY_CARE_PROVIDER_SITE_OTHER): Payer: Managed Care, Other (non HMO) | Admitting: Physician Assistant

## 2017-04-07 VITALS — BP 126/89 | HR 86 | Temp 98.0°F | Resp 16 | Ht 70.0 in | Wt 168.0 lb

## 2017-04-07 DIAGNOSIS — J0140 Acute pansinusitis, unspecified: Secondary | ICD-10-CM | POA: Diagnosis not present

## 2017-04-07 MED ORDER — GUAIFENESIN ER 1200 MG PO TB12: 1.0000 | ORAL_TABLET | Freq: Two times a day (BID) | ORAL | 1 refills | Status: DC | PRN

## 2017-04-07 MED ORDER — CETIRIZINE HCL 10 MG PO TABS: 10.0000 mg | ORAL_TABLET | Freq: Every day | ORAL | 11 refills | Status: AC

## 2017-04-07 MED ORDER — DOXYCYCLINE HYCLATE 100 MG PO CAPS: 100.0000 mg | ORAL_CAPSULE | Freq: Two times a day (BID) | ORAL | 0 refills | Status: AC

## 2017-04-07 MED ORDER — FLUTICASONE PROPIONATE 50 MCG/ACT NA SUSP: 2.0000 | Freq: Every day | NASAL | 12 refills | Status: DC

## 2017-04-07 NOTE — Patient Instructions (Addendum)
Make sure that you are hydrating well with 64 oz per day.   Please use tylenol or ibuprofen for your pain and fever.  You can try 600mg  every 8 hours of the ibuprofen.    Sinusitis, Adult Sinusitis is soreness and inflammation of your sinuses. Sinuses are hollow spaces in the bones around your face. They are located:  Around your eyes.  In the middle of your forehead.  Behind your nose.  In your cheekbones.  Your sinuses and nasal passages are lined with a stringy fluid (mucus). Mucus normally drains out of your sinuses. When your nasal tissues get inflamed or swollen, the mucus can get trapped or blocked so air cannot flow through your sinuses. This lets bacteria, viruses, and funguses grow, and that leads to infection. Follow these instructions at home: Medicines  Take, use, or apply over-the-counter and prescription medicines only as told by your doctor. These may include nasal sprays.  If you were prescribed an antibiotic medicine, take it as told by your doctor. Do not stop taking the antibiotic even if you start to feel better. Hydrate and Humidify  Drink enough water to keep your pee (urine) clear or pale yellow.  Use a cool mist humidifier to keep the humidity level in your home above 50%.  Breathe in steam for 10-15 minutes, 3-4 times a day or as told by your doctor. You can do this in the bathroom while a hot shower is running.  Try not to spend time in cool or dry air. Rest  Rest as much as possible.  Sleep with your head raised (elevated).  Make sure to get enough sleep each night. General instructions  Put a warm, moist washcloth on your face 3-4 times a day or as told by your doctor. This will help with discomfort.  Wash your hands often with soap and water. If there is no soap and water, use hand sanitizer.  Do not smoke. Avoid being around people who are smoking (secondhand smoke).  Keep all follow-up visits as told by your doctor. This is  important. Contact a doctor if:  You have a fever.  Your symptoms get worse.  Your symptoms do not get better within 10 days. Get help right away if:  You have a very bad headache.  You cannot stop throwing up (vomiting).  You have pain or swelling around your face or eyes.  You have trouble seeing.  You feel confused.  Your neck is stiff.  You have trouble breathing. This information is not intended to replace advice given to you by your health care provider. Make sure you discuss any questions you have with your health care provider. Document Released: 11/10/2007 Document Revised: 01/18/2016 Document Reviewed: 03/19/2015 Elsevier Interactive Patient Education  2018 [employer]    IF you received an x-ray today, you will receive an invoice from [facility]. Please contact [facility] at [phone] with questions or concerns regarding your invoice.   IF you received labwork today, you will receive an invoice from [facility]. Please contact [facility] at [phone] with questions or concerns regarding your invoice.   Our billing staff will not be able to assist you with questions regarding bills from these companies.  You will be contacted with the lab results as soon as they are available. The fastest way to get your results is to activate your My Chart account. Instructions are located on the last page of this paperwork. If you have not heard from us regarding the results in  2 weeks, please contact this office.

## 2017-04-07 NOTE — Progress Notes (Signed)
PRIMARY CARE AT [facility] [address] [place] [address] [phone]  Date:  04/07/2017   Name:  TANNER YELEY   DOB:  09/06/1960   MRN:  [id number]  PCP:  Shade Flood, MD    History of Present Illness:  JAXTIN RAIMONDO is a 56 y.o. male patient who presents to PCP with  Chief Complaint  Patient presents with  . Sinusitis    x 2 days  . Sore Throat    x 2 days  . Fatigue     Watery eyes, congestion, for about 9 days.  This has progressively worsened this week.  Wake in the morning with head congestion--which can improve with getting up and going about the day.  No sneezing.  Pain across head.  Thick mucus that is clear.  Cough is non-productive.  Subjective fever, but normal temperature reported.   Throat pain.  He is using a nasal spray.   Patient Active Problem List   Diagnosis Date Noted  . Recurrent umbilical hernia 06/29/2011    Past Medical History:  Diagnosis Date  . Hypertension    dr Merla Riches  . Umbilical hernia     Past Surgical History:  Procedure Laterality Date  . HERNIA REPAIR  01/2002  . UMBILICAL HERNIA REPAIR  09/01/2011   Procedure: HERNIA REPAIR UMBILICAL ADULT;  Surgeon: Shelly Rubenstein, MD;  Location: [facility] OR;  Service: General;  Laterality: N/A;    Social History  Substance Use Topics  . Smoking status: Never Smoker  . Smokeless tobacco: Never Used  . Alcohol use No    Family History  Problem Relation Age of Onset  . Cancer Father        lung  . Pneumonia Father   . Hypertension Mother   . Cancer Maternal Grandmother   . Heart disease Paternal Grandfather     Allergies  Allergen Reactions  . Penicillins Hives, Nausea And Vomiting and Swelling    Medication list has been reviewed and updated.  Current Outpatient Prescriptions on File Prior to Visit  Medication Sig Dispense Refill  . amLODipine (NORVASC) 10 MG tablet Take 1 tablet (10 mg total) by mouth daily. 90 tablet 1   No current facility-administered medications  on file prior to visit.     ROS ROS otherwise unremarkable unless listed above.  Physical Examination: BP 126/89   Pulse 86   Temp 98 F (36.7 C) (Oral)   Resp 16   Ht 5\' 10"  (1.778 m)   Wt 168 lb (76.2 kg)   SpO2 98%   BMI 24.11 kg/m  Ideal Body Weight: Weight in (lb) to have BMI = 25: 173.9  Physical Exam  Constitutional: He is oriented to person, place, and time. He appears well-developed and well-nourished. No distress.  HENT:  Head: Atraumatic.  Right Ear: Tympanic membrane, external ear and ear canal normal.  Left Ear: Tympanic membrane, external ear and ear canal normal.  Nose: Mucosal edema and rhinorrhea present. Right sinus exhibits no maxillary sinus tenderness and no frontal sinus tenderness. Left sinus exhibits no maxillary sinus tenderness and no frontal sinus tenderness.  Mouth/Throat: No uvula swelling. No oropharyngeal exudate, posterior oropharyngeal edema or posterior oropharyngeal erythema.  Eyes: Conjunctivae, EOM and lids are normal. Pupils are equal, round, and reactive to light. Right eye exhibits normal extraocular motion. Left eye exhibits normal extraocular motion.  Neck: Trachea normal and full passive range of motion without pain. No edema and no erythema present.  Cardiovascular: Normal  rate.  Pulmonary/Chest: Effort normal. No respiratory distress. He has no decreased breath sounds. He has no wheezes. He has no rhonchi.  Neurological: He is alert and oriented to person, place, and time.  Skin: Skin is warm and dry. He is not diaphoretic.  Psychiatric: He has a normal mood and affect. His behavior is normal.     Assessment and Plan: Renard MatterGeorge M Span is a 56 y.o. male who is here today for cc of  Chief Complaint  Patient presents with  . Sinusitis    x 2 days  . Sore Throat    x 2 days  . Fatigue  given longevity, and worsening symptoms, will treat if there is no improvement in 48-72 hours of supportive 2nd Gen antihistamines.   Subacute  pansinusitis - Plan: Guaifenesin (MUCINEX MAXIMUM STRENGTH) 1200 MG TB12, fluticasone (FLONASE) 50 MCG/ACT nasal spray, cetirizine (ZYRTEC) 10 MG tablet, doxycycline (VIBRAMYCIN) 100 MG capsule  Trena PlattStephanie Tavone Caesar, PA-C [facility] 11/6/201811:18 AM

## 2017-04-12 ENCOUNTER — Encounter: Payer: Self-pay | Admitting: Physician Assistant

## 2017-07-09 ENCOUNTER — Other Ambulatory Visit: Payer: Self-pay | Admitting: Family Medicine

## 2017-07-09 DIAGNOSIS — I10 Essential (primary) hypertension: Secondary | ICD-10-CM

## 2017-09-14 ENCOUNTER — Encounter: Payer: Self-pay | Admitting: Physician Assistant

## 2017-11-10 ENCOUNTER — Ambulatory Visit (INDEPENDENT_AMBULATORY_CARE_PROVIDER_SITE_OTHER): Payer: Managed Care, Other (non HMO) | Admitting: Family Medicine

## 2017-11-10 ENCOUNTER — Encounter: Payer: Self-pay | Admitting: Family Medicine

## 2017-11-10 ENCOUNTER — Other Ambulatory Visit: Payer: Self-pay

## 2017-11-10 VITALS — BP 140/86 | HR 90 | Temp 98.4°F | Resp 18 | Ht 70.0 in | Wt 168.8 lb

## 2017-11-10 DIAGNOSIS — J309 Allergic rhinitis, unspecified: Secondary | ICD-10-CM

## 2017-11-10 DIAGNOSIS — I10 Essential (primary) hypertension: Secondary | ICD-10-CM

## 2017-11-10 MED ORDER — FLUTICASONE PROPIONATE 50 MCG/ACT NA SUSP: 2.0000 | Freq: Every day | NASAL | 3 refills | Status: DC

## 2017-11-10 NOTE — Patient Instructions (Addendum)
Keep a record of your blood pressures and bring those to a follow-up visit in the next few weeks.  Ideally I would like to see you fasting at that visit so we can check blood work and follow-up from last year's appointment.  Current symptoms appear to be due to allergies, less likely upper respiratory infection from a virus.  Restart Flonase 2 sprays per nostril each day, then as symptoms improve can decrease to 1 spray per nostril each day.  Saline nasal spray as needed for congestion, can change from Benadryl to Zyrtec as symptoms improve, recheck if not improving.  Return to the clinic or go to the nearest emergency room if any of your symptoms worsen or new symptoms occur.  Allergic Rhinitis, Adult Allergic rhinitis is an allergic reaction that affects the mucous membrane inside the nose. It causes sneezing, a runny or stuffy nose, and the feeling of mucus going down the back of the throat (postnasal drip). Allergic rhinitis can be mild to severe. There are two types of allergic rhinitis:  Seasonal. This type is also called hay fever. It happens only during certain seasons.  Perennial. This type can happen at any time of the year.  What are the causes? This condition happens when the body's defense system (immune system) responds to certain harmless substances called allergens as though they were germs.  Seasonal allergic rhinitis is triggered by pollen, which can come from grasses, trees, and weeds. Perennial allergic rhinitis may be caused by:  House dust mites.  Pet dander.  Mold spores.  What are the signs or symptoms? Symptoms of this condition include:  Sneezing.  Runny or stuffy nose (nasal congestion).  Postnasal drip.  Itchy nose.  Tearing of the eyes.  Trouble sleeping.  Daytime sleepiness.  How is this diagnosed? This condition may be diagnosed based on:  Your medical history.  A physical exam.  Tests to check for related conditions, such  as: ? Asthma. ? Pink eye. ? Ear infection. ? Upper respiratory infection.  Tests to find out which allergens trigger your symptoms. These may include skin or blood tests.  How is this treated? There is no cure for this condition, but treatment can help control symptoms. Treatment may include:  Taking medicines that block allergy symptoms, such as antihistamines. Medicine may be given as a shot, nasal spray, or pill.  Avoiding the allergen.  Desensitization. This treatment involves getting ongoing shots until your body becomes less sensitive to the allergen. This treatment may be done if other treatments do not help.  If taking medicine and avoiding the allergen does not work, new, stronger medicines may be prescribed.  Follow these instructions at home:  Find out what you are allergic to. Common allergens include smoke, dust, and pollen.  Avoid the things you are allergic to. These are some things you can do to help avoid allergens: ? Replace carpet with wood, tile, or vinyl flooring. Carpet can trap dander and dust. ? Do not smoke. Do not allow smoking in your home. ? Change your heating and air conditioning filter at least once a month. ? During allergy season:  Keep windows closed as much as possible.  Plan outdoor activities when pollen counts are lowest. This is usually during the evening hours.  When coming indoors, change clothing and shower before sitting on furniture or bedding.  Take over-the-counter and prescription medicines only as told by your health care provider.  Keep all follow-up visits as told by your health care  provider. This is important. Contact a health care provider if:  You have a fever.  You develop a persistent cough.  You make whistling sounds when you breathe (you wheeze).  Your symptoms interfere with your normal daily activities. Get help right away if:  You have shortness of breath. Summary  This condition can be managed by taking  medicines as directed and avoiding allergens.  Contact your health care provider if you develop a persistent cough or fever.  During allergy season, keep windows closed as much as possible. This information is not intended to replace advice given to you by your health care provider. Make sure you discuss any questions you have with your health care provider. Document Released: 02/16/2001 Document Revised: 07/01/2016 Document Reviewed: 07/01/2016 Elsevier Interactive Patient Education  2018 [employer]    IF you received an x-ray today, you will receive an invoice from [facility]. Please contact [facility] at [phone] with questions or concerns regarding your invoice.   IF you received labwork today, you will receive an invoice from [facility]. Please contact [facility] at [phone] with questions or concerns regarding your invoice.   Our billing staff will not be able to assist you with questions regarding bills from these companies.  You will be contacted with the lab results as soon as they are available. The fastest way to get your results is to activate your My Chart account. Instructions are located on the last page of this paperwork. If you have not heard from us regarding the results in 2 weeks, please contact this office.

## 2017-11-10 NOTE — Progress Notes (Signed)
Subjective:    Patient ID: Willie [name], male    DOB: 1961/05/23, 57 y.o.   MRN: [id number]  HPI Willie [name] is a 57 y.o. male Presents today for: Chief Complaint  Patient presents with  . Allergies    head congestion, runny nose, tiredness    Presents with allergy symptoms. Noted mostly yesterday with head congestion, runny nose, watery eyes. Pressure into ears today. No fever. No known sick contacts. Fatigued/tired today. Eating/drinking today.   Attempted treatment: Benadryl up to every 6 hours. Starting to help today.  flonase NS - off and on as needed, 3 times over past few weeks.  No recent zyrtec as had been feeling fine  -  Off and on allergies.   Hypertension: No recent missed doses of meds. Home BP in 130/80's.  No swelling to discuss hypertension and hyperglycemia June 2018.  Plans on follow-up in the next month to review medications and fasting blood work at that time.  Not fasting today.   Patient Active Problem List   Diagnosis Date Noted  . 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 Hypertension    dr Merla Riches  . Umbilical hernia    Past Surgical History:  Procedure Laterality Date  . HERNIA REPAIR  01/2002  . UMBILICAL HERNIA REPAIR  09/01/2011   Procedure: HERNIA REPAIR UMBILICAL ADULT;  Surgeon: Shelly Rubenstein, MD;  Location: [facility] OR;  Service: General;  Laterality: N/A;   Allergies  Allergen Reactions  . Penicillins Hives, Nausea And Vomiting and Swelling   Prior to Admission medications   Medication Sig Start Date End Date Taking? Authorizing Provider  amLODipine (NORVASC) 10 MG tablet TAKE 1 TABLET BY MOUTH ONCE DAILY 07/11/17  Yes Shade Flood, MD  Guaifenesin [facility] MAXIMUM STRENGTH) 1200 MG TB12 Take 1 tablet (1,200 mg total) by mouth every 12 (twelve) hours as needed. 04/07/17  Yes English, Judeth Cornfield D, PA  cetirizine (ZYRTEC) 10 MG tablet Take 1 tablet (10 mg total) by mouth daily. Patient not  taking: Reported on 11/10/2017 04/07/17   Trena Platt D, PA  fluticasone [facility]) 50 MCG/ACT nasal spray Place 2 sprays into both nostrils daily. Patient not taking: Reported on 11/10/2017 04/07/17   Garnetta Buddy, PA   Social History   Socioeconomic History  . Marital status: Married    Spouse name: Not on file  . Number of children: Not on file  . Years of education: Not on file  . Highest education level: Not on file  Occupational History  . Not on file  Social Needs  . Financial resource strain: Not on file  . Food insecurity:    Worry: Not on file    Inability: Not on file  . Transportation needs:    Medical: Not on file    Non-medical: Not on file  Tobacco Use  . Smoking status: Never Smoker  . Smokeless tobacco: Never Used  Substance and Sexual Activity  . Alcohol use: No  . Drug use: No  . Sexual activity: Yes  Lifestyle  . Physical activity:    Days per week: Not on file    Minutes per session: Not on file  . Stress: Not on file  Relationships  . Social connections:    Talks on phone: Not on file    Gets together: Not on file    Attends religious service: Not on file    Active member of club or organization: Not  on file    Attends meetings of clubs or organizations: Not on file    Relationship status: Not on file  . Intimate partner violence:    Fear of current or ex partner: Not on file    Emotionally abused: Not on file    Physically abused: Not on file    Forced sexual activity: Not on file  Other Topics Concern  . Not on file  Social History Narrative  . Not on file    Review of Systems  Constitutional: Negative for fever.  HENT: Positive for congestion and rhinorrhea.   Eyes: Positive for itching.       Objective:   Physical Exam  Constitutional: He is oriented to person, place, and time. He appears well-developed and well-nourished.  HENT:  Head: Normocephalic and atraumatic.  Right Ear: Tympanic membrane, external ear and ear  canal normal.  Left Ear: Tympanic membrane, external ear and ear canal normal.  Nose: No rhinorrhea.  Mouth/Throat: Oropharynx is clear and moist and mucous membranes are normal. No oropharyngeal exudate or posterior oropharyngeal erythema.  Eyes: Pupils are equal, round, and reactive to light. Conjunctivae are normal.  Neck: Neck supple.  Cardiovascular: Normal rate, regular rhythm, normal heart sounds and intact distal pulses.  No murmur heard. Pulmonary/Chest: Effort normal and breath sounds normal. He has no wheezes. He has no rhonchi. He has no rales.  Abdominal: Soft. There is no tenderness.  Lymphadenopathy:    He has no cervical adenopathy.  Neurological: He is alert and oriented to person, place, and time.  Skin: Skin is warm and dry. No rash noted.  Psychiatric: He has a normal mood and affect. His behavior is normal.  Vitals reviewed.  Vitals:   11/10/17 1427  BP: (!) 145/86  Pulse: 90  Resp: 18  Temp: 98.4 F (36.9 C)  TempSrc: Oral  SpO2: 97%  Weight: 168 lb 12.8 oz (76.6 kg)  Height: 5\' 10"  (1.778 m)      Assessment & Plan:   Willie [name] is a 57 y.o. male Allergic rhinitis, unspecified seasonality, unspecified trigger - Plan: fluticasone (FLONASE) 50 MCG/ACT nasal spray  -Suspect allergic cause explained.  Most likely viral URI.  No signs of sinus infection at present.  Restart Flonase 2 sprays per nostril each day, Zyrtec in place of Benadryl, and RTC precautions given.  Symptomatic care with saline nasal spray if needed.   Essential hypertension  -borderline in office, improved at home.  Bring home readings to follow-up within the next 1 month with.  Fasting lab work at that time.  Meds ordered this encounter  Medications  . fluticasone (FLONASE) 50 MCG/ACT nasal spray    Sig: Place 2 sprays into both nostrils daily.    Dispense:  48 g    Refill:  3   Patient Instructions    Keep a record of your blood pressures and bring those to a follow-up visit  in the next few weeks.  Ideally I would like to see you fasting at that visit so we can check blood work and follow-up from last year's appointment.  Current symptoms appear to be due to allergies, less likely upper respiratory infection from a virus.  Restart Flonase 2 sprays per nostril each day, then as symptoms improve can decrease to 1 spray per nostril each day.  Saline nasal spray as needed for congestion, can change from Benadryl to Zyrtec as symptoms improve, recheck if not improving.  Return to the clinic or go to  the nearest emergency room if any of your symptoms worsen or new symptoms occur.  Allergic Rhinitis, Adult Allergic rhinitis is an allergic reaction that affects the mucous membrane inside the nose. It causes sneezing, a runny or stuffy nose, and the feeling of mucus going down the back of the throat (postnasal drip). Allergic rhinitis can be mild to severe. There are two types of allergic rhinitis:  Seasonal. This type is also called hay fever. It happens only during certain seasons.  Perennial. This type can happen at any time of the year.  What are the causes? This condition happens when the body's defense system (immune system) responds to certain harmless substances called allergens as though they were germs.  Seasonal allergic rhinitis is triggered by pollen, which can come from grasses, trees, and weeds. Perennial allergic rhinitis may be caused by:  House dust mites.  Pet dander.  Mold spores.  What are the signs or symptoms? Symptoms of this condition include:  Sneezing.  Runny or stuffy nose (nasal congestion).  Postnasal drip.  Itchy nose.  Tearing of the eyes.  Trouble sleeping.  Daytime sleepiness.  How is this diagnosed? This condition may be diagnosed based on:  Your medical history.  A physical exam.  Tests to check for related conditions, such as: ? Asthma. ? Pink eye. ? Ear infection. ? Upper respiratory infection.  Tests to  find out which allergens trigger your symptoms. These may include skin or blood tests.  How is this treated? There is no cure for this condition, but treatment can help control symptoms. Treatment may include:  Taking medicines that block allergy symptoms, such as antihistamines. Medicine may be given as a shot, nasal spray, or pill.  Avoiding the allergen.  Desensitization. This treatment involves getting ongoing shots until your body becomes less sensitive to the allergen. This treatment may be done if other treatments do not help.  If taking medicine and avoiding the allergen does not work, new, stronger medicines may be prescribed.  Follow these instructions at home:  Find out what you are allergic to. Common allergens include smoke, dust, and pollen.  Avoid the things you are allergic to. These are some things you can do to help avoid allergens: ? Replace carpet with wood, tile, or vinyl flooring. Carpet can trap dander and dust. ? Do not smoke. Do not allow smoking in your home. ? Change your heating and air conditioning filter at least once a month. ? During allergy season:  Keep windows closed as much as possible.  Plan outdoor activities when pollen counts are lowest. This is usually during the evening hours.  When coming indoors, change clothing and shower before sitting on furniture or bedding.  Take over-the-counter and prescription medicines only as told by your health care provider.  Keep all follow-up visits as told by your health care provider. This is important. Contact a health care provider if:  You have a fever.  You develop a persistent cough.  You make whistling sounds when you breathe (you wheeze).  Your symptoms interfere with your normal daily activities. Get help right away if:  You have shortness of breath. Summary  This condition can be managed by taking medicines as directed and avoiding allergens.  Contact your health care provider if you  develop a persistent cough or fever.  During allergy season, keep windows closed as much as possible. This information is not intended to replace advice given to you by your health care provider. Make sure you  discuss any questions you have with your health care provider. Document Released: 02/16/2001 Document Revised: 07/01/2016 Document Reviewed: 07/01/2016 Elsevier Interactive Patient Education  2018 [employer].    IF you received an x-ray today, you will receive an invoice from [facility]. Please contact [facility] at [phone] with questions or concerns regarding your invoice.   IF you received labwork today, you will receive an invoice from [facility]. Please contact [facility] at [phone] with questions or concerns regarding your invoice.   Our billing staff will not be able to assist you with questions regarding bills from these companies.  You will be contacted with the lab results as soon as they are available. The fastest way to get your results is to activate your My Chart account. Instructions are located on the last page of this paperwork. If you have not heard from [place] regarding the results in 2 weeks, please contact this office.      Signed,   Meredith Staggers, MD Primary Care at [facility].  11/10/17 3:07 PM

## 2017-12-12 ENCOUNTER — Other Ambulatory Visit: Payer: Self-pay

## 2017-12-12 ENCOUNTER — Ambulatory Visit (INDEPENDENT_AMBULATORY_CARE_PROVIDER_SITE_OTHER): Payer: Managed Care, Other (non HMO) | Admitting: Family Medicine

## 2017-12-12 ENCOUNTER — Encounter: Payer: Self-pay | Admitting: Family Medicine

## 2017-12-12 VITALS — BP 127/82 | HR 73 | Temp 98.0°F | Ht 70.0 in | Wt 170.2 lb

## 2017-12-12 DIAGNOSIS — R739 Hyperglycemia, unspecified: Secondary | ICD-10-CM | POA: Diagnosis not present

## 2017-12-12 DIAGNOSIS — Z114 Encounter for screening for human immunodeficiency virus [HIV]: Secondary | ICD-10-CM

## 2017-12-12 DIAGNOSIS — Z1159 Encounter for screening for other viral diseases: Secondary | ICD-10-CM

## 2017-12-12 DIAGNOSIS — Z23 Encounter for immunization: Secondary | ICD-10-CM | POA: Diagnosis not present

## 2017-12-12 DIAGNOSIS — I10 Essential (primary) hypertension: Secondary | ICD-10-CM | POA: Diagnosis not present

## 2017-12-12 DIAGNOSIS — Z1211 Encounter for screening for malignant neoplasm of colon: Secondary | ICD-10-CM

## 2017-12-12 DIAGNOSIS — E781 Pure hyperglyceridemia: Secondary | ICD-10-CM | POA: Diagnosis not present

## 2017-12-12 MED ORDER — AMLODIPINE BESYLATE 10 MG PO TABS: 10.0000 mg | ORAL_TABLET | Freq: Every day | ORAL | 2 refills | Status: DC

## 2017-12-12 NOTE — Progress Notes (Signed)
Subjective:  By signing my name below, I, Stann Ore, attest that this documentation has been prepared under the direction and in the presence of Meredith Staggers, MD. Electronically Signed: Stann Ore, Scribe. 12/12/2017 , 2:16 PM .  Patient was seen in Room 12 .   Patient ID: Willie [name], male    DOB: [birth year], 57 y.o.   MRN: [id number] Chief Complaint  Patient presents with  . BP Check and lab    1 month follow up    HPI Willie [name] is a 57 y.o. male Here for follow up. He is fasting today.   HTN He takes amlodipine 10 mg qd. He denies side effects with this medication. He does notice some ankle swelling during the day, but improves after elevation over night. He denies chest pain, shortness of breath, lightheadedness, dizziness, or dark tarry stools.   BP Readings from Last 3 Encounters:  12/12/17 127/82  11/10/17 140/86  04/07/17 126/89   Lab Results  Component Value Date   CREATININE 1.03 11/26/2016    Hyperlipidemia Lab Results  Component Value Date   CHOL 182 11/26/2016   HDL 43 11/26/2016   LDLCALC 94 11/26/2016   TRIG 227 (H) 11/26/2016   CHOLHDL 4.2 11/26/2016   Lab Results  Component Value Date   ALT 20 11/26/2016   AST 19 11/26/2016   ALKPHOS 88 11/26/2016   BILITOT 0.4 11/26/2016    Hyperglycemia His glucose has been 104-114 over the past 2 years, but normal A1C.   Lab Results  Component Value Date   HGBA1C 5.6 11/26/2016   Wt Readings from Last 3 Encounters:  12/12/17 170 lb 3.2 oz (77.2 kg)  11/10/17 168 lb 12.8 oz (76.6 kg)  04/07/17 168 lb (76.2 kg)   Health maintenance Agrees to HIV and Hep C screening.  Tetanus vaccine: agrees to update today.  Colonoscopy: hasn't scheduled yet; referral sent in today.   Patient Active Problem List   Diagnosis Date Noted  . 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 Hypertension    dr Merla Riches  . Umbilical hernia    Past Surgical History:    Procedure Laterality Date  . HERNIA REPAIR  01/2002  . UMBILICAL HERNIA REPAIR  09/01/2011   Procedure: HERNIA REPAIR UMBILICAL ADULT;  Surgeon: Shelly Rubenstein, MD;  Location: [facility] OR;  Service: General;  Laterality: N/A;   Allergies  Allergen Reactions  . Penicillins Hives, Nausea And Vomiting and Swelling   Prior to Admission medications   Medication Sig Start Date End Date Taking? Authorizing Provider  amLODipine (NORVASC) 10 MG tablet TAKE 1 TABLET BY MOUTH ONCE DAILY 07/11/17   Shade Flood, MD  cetirizine (ZYRTEC) 10 MG tablet Take 1 tablet (10 mg total) by mouth daily. Patient not taking: Reported on 11/10/2017 04/07/17   Trena Platt D, PA  fluticasone [facility]) 50 MCG/ACT nasal spray Place 2 sprays into both nostrils daily. 11/10/17   Shade Flood, MD  Guaifenesin [facility] MAXIMUM STRENGTH) 1200 MG TB12 Take 1 tablet (1,200 mg total) by mouth every 12 (twelve) hours as needed. 04/07/17   Garnetta Buddy, PA   Social History   Socioeconomic History  . Marital status: Married    Spouse name: Not on file  . Number of children: Not on file  . Years of education: Not on file  . Highest education level: Not on file  Occupational History  . Not on file  Social  Needs  . Financial resource strain: Not on file  . Food insecurity:    Worry: Not on file    Inability: Not on file  . Transportation needs:    Medical: Not on file    Non-medical: Not on file  Tobacco Use  . Smoking status: Never Smoker  . Smokeless tobacco: Never Used  Substance and Sexual Activity  . Alcohol use: No  . Drug use: No  . Sexual activity: Yes  Lifestyle  . Physical activity:    Days per week: Not on file    Minutes per session: Not on file  . Stress: Not on file  Relationships  . Social connections:    Talks on phone: Not on file    Gets together: Not on file    Attends religious service: Not on file    Active member of club or organization: Not on file    Attends meetings of  clubs or organizations: Not on file    Relationship status: Not on file  . Intimate partner violence:    Fear of current or ex partner: Not on file    Emotionally abused: Not on file    Physically abused: Not on file    Forced sexual activity: Not on file  Other Topics Concern  . Not on file  Social History Narrative  . Not on file   Review of Systems  Constitutional: Negative for fatigue and unexpected weight change.  Eyes: Negative for visual disturbance.  Respiratory: Negative for cough, chest tightness and shortness of breath.   Cardiovascular: Negative for chest pain, palpitations and leg swelling.  Gastrointestinal: Negative for abdominal pain and blood in stool.  Neurological: Negative for dizziness, light-headedness and headaches.       Objective:   Physical Exam  Constitutional: He is oriented to person, place, and time. He appears well-developed and well-nourished.  HENT:  Head: Normocephalic and atraumatic.  Eyes: Pupils are equal, round, and reactive to light. EOM are normal.  Neck: No JVD present. Carotid bruit is not present.  Cardiovascular: Normal rate, regular rhythm and normal heart sounds.  No murmur heard. Pulmonary/Chest: Effort normal and breath sounds normal. He has no rales.  Musculoskeletal: He exhibits no edema.  Neurological: He is alert and oriented to person, place, and time.  Skin: Skin is warm and dry.  Psychiatric: He has a normal mood and affect.  Vitals reviewed.   Vitals:   12/12/17 1352  BP: 127/82  Pulse: 73  Temp: 98 F (36.7 C)  TempSrc: Oral  SpO2: 98%  Weight: 170 lb 3.2 oz (77.2 kg)  Height: 5\' 10"  (1.778 m)       Assessment & Plan:   Willie [name] is a 57 y.o. male Essential hypertension - Plan: amLODipine (NORVASC) 10 MG tablet  -  Stable, tolerating current regimen. Medications refilled. Labs pending.   Hypertriglyceridemia - Plan: Lipid panel, Comprehensive metabolic panel  - check labs to determine statin  need/recommendations. Monitor diet/exercise.   Hyperglycemia - Plan: Hemoglobin A1c  -Borderline A1c prior, repeat A1c to screen for diabetes.  Monitor diet/exercise.  Need for Tdap vaccination - Plan: Tdap vaccine greater than or equal to 7yo IM given  Screening for HIV (human immunodeficiency virus) - Plan: HIV antibody  Screen for colon cancer - Plan: Ambulatory referral to Gastroenterology  -Referral placed to GI for screening colonoscopy.  Plans to schedule physical in the next 6 months and can review other health maintenance items at that time if  needed.  Encounter for hepatitis C screening test for low risk patient - Plan: Hepatitis C antibody   Meds ordered this encounter  Medications  . amLODipine (NORVASC) 10 MG tablet    Sig: Take 1 tablet (10 mg total) by mouth daily.    Dispense:  90 tablet    Refill:  2   Patient Instructions   Thanks for coming in today.  No change in blood pressure medication for now.  I will check the labs as we discussed.  I also referred you for colonoscopy and Tdap vaccine was updated today.  Follow-up in 6 months for physical, let me know if there are questions sooner.   IF you received an x-ray today, you will receive an invoice from [facility]. Please contact [facility] at [phone] with questions or concerns regarding your invoice.   IF you received labwork today, you will receive an invoice from [facility]. Please contact [facility] at [phone] with questions or concerns regarding your invoice.   Our billing staff will not be able to assist you with questions regarding bills from these companies.  You will be contacted with the lab results as soon as they are available. The fastest way to get your results is to activate your My Chart account. Instructions are located on the last page of this paperwork. If you have not heard from us regarding the results in 2 weeks, please contact this office.       I personally  performed the services described in this documentation, which was scribed in my presence. The recorded information has been reviewed and considered for accuracy and completeness, addended by me as needed, and agree with information above.  Signed,   Meredith StaggersJeffrey Tekoa Amon, MD Primary Care at [facility].  12/12/17 2:24 PM

## 2017-12-12 NOTE — Patient Instructions (Addendum)
Thanks for coming in today.  No change in blood pressure medication for now.  I will check the labs as we discussed.  I also referred you for colonoscopy and Tdap vaccine was updated today.  Follow-up in 6 months for physical, let me know if there are questions sooner.   IF you received an x-ray today, you will receive an invoice from [facility]. Please contact [facility] at [phone] with questions or concerns regarding your invoice.   IF you received labwork today, you will receive an invoice from [facility]. Please contact [facility] at [phone] with questions or concerns regarding your invoice.   Our billing staff will not be able to assist you with questions regarding bills from these companies.  You will be contacted with the lab results as soon as they are available. The fastest way to get your results is to activate your My Chart account. Instructions are located on the last page of this paperwork. If you have not heard from us regarding the results in 2 weeks, please contact this office.

## 2017-12-13 LAB — COMPREHENSIVE METABOLIC PANEL
ALK PHOS: 73 IU/L (ref 39–117)
ALT: 23 IU/L (ref 0–44)
AST: 19 IU/L (ref 0–40)
Albumin/Globulin Ratio: 1.4 (ref 1.2–2.2)
Albumin: 4.3 g/dL (ref 3.5–5.5)
BILIRUBIN TOTAL: 0.4 mg/dL (ref 0.0–1.2)
BUN/Creatinine Ratio: 12 (ref 9–20)
BUN: 13 mg/dL (ref 6–24)
CHLORIDE: 103 mmol/L (ref 96–106)
CO2: 24 mmol/L (ref 20–29)
Calcium: 9.6 mg/dL (ref 8.7–10.2)
Creatinine, Ser: 1.08 mg/dL (ref 0.76–1.27)
GFR calc non Af Amer: 77 mL/min/{1.73_m2} (ref >59)
GFR, EST AFRICAN AMERICAN: 89 mL/min/{1.73_m2} (ref >59)
Globulin, Total: 3 g/dL (ref 1.5–4.5)
Glucose: 99 mg/dL (ref 65–99)
POTASSIUM: 4.3 mmol/L (ref 3.5–5.2)
Sodium: 144 mmol/L (ref 134–144)
Total Protein: 7.3 g/dL (ref 6.0–8.5)

## 2017-12-13 LAB — HEMOGLOBIN A1C
ESTIMATED AVERAGE GLUCOSE: 114 mg/dL
Hgb A1c MFr Bld: 5.6 % (ref 4.8–5.6)

## 2017-12-13 LAB — LIPID PANEL
CHOL/HDL RATIO: 4.2 ratio (ref 0.0–5.0)
Cholesterol, Total: 188 mg/dL (ref 100–199)
HDL: 45 mg/dL (ref >39)
LDL CALC: 101 mg/dL — AB (ref 0–99)
TRIGLYCERIDES: 209 mg/dL — AB (ref 0–149)
VLDL CHOLESTEROL CAL: 42 mg/dL — AB (ref 5–40)

## 2017-12-13 LAB — HIV ANTIBODY (ROUTINE TESTING W REFLEX): HIV Screen 4th Generation wRfx: NONREACTIVE

## 2017-12-13 LAB — HEPATITIS C ANTIBODY

## 2018-03-01 ENCOUNTER — Encounter: Payer: Self-pay | Admitting: Family Medicine

## 2018-05-18 ENCOUNTER — Encounter: Payer: Self-pay | Admitting: Gastroenterology

## 2018-05-18 ENCOUNTER — Encounter: Payer: Self-pay | Admitting: Physician Assistant

## 2018-05-18 ENCOUNTER — Other Ambulatory Visit: Payer: Self-pay

## 2018-05-18 ENCOUNTER — Ambulatory Visit (INDEPENDENT_AMBULATORY_CARE_PROVIDER_SITE_OTHER): Payer: Managed Care, Other (non HMO) | Admitting: Physician Assistant

## 2018-05-18 VITALS — BP 149/99 | HR 85 | Temp 97.8°F | Resp 16 | Ht 71.0 in | Wt 172.2 lb

## 2018-05-18 DIAGNOSIS — J309 Allergic rhinitis, unspecified: Secondary | ICD-10-CM | POA: Diagnosis not present

## 2018-05-18 DIAGNOSIS — M546 Pain in thoracic spine: Secondary | ICD-10-CM | POA: Diagnosis not present

## 2018-05-18 DIAGNOSIS — M791 Myalgia, unspecified site: Secondary | ICD-10-CM

## 2018-05-18 LAB — POCT URINALYSIS DIP (MANUAL ENTRY)
Bilirubin, UA: NEGATIVE
Blood, UA: NEGATIVE
Glucose, UA: NEGATIVE mg/dL
Ketones, POC UA: NEGATIVE mg/dL
Leukocytes, UA: NEGATIVE
Nitrite, UA: NEGATIVE
Protein Ur, POC: NEGATIVE mg/dL
Spec Grav, UA: 1.02 (ref 1.010–1.025)
Urobilinogen, UA: 0.2 U/dL
pH, UA: 7 (ref 5.0–8.0)

## 2018-05-18 LAB — POC MICROSCOPIC URINALYSIS (UMFC): Mucus: ABSENT

## 2018-05-18 MED ORDER — CYCLOBENZAPRINE HCL 10 MG PO TABS: 10.0000 mg | ORAL_TABLET | Freq: Three times a day (TID) | ORAL | 1 refills | Status: DC | PRN

## 2018-05-18 MED ORDER — FLUTICASONE PROPIONATE 50 MCG/ACT NA SUSP: 2.0000 | Freq: Every day | NASAL | 3 refills | Status: DC

## 2018-05-18 NOTE — Patient Instructions (Addendum)
Flexeril is a muscle relaxer. This may make you drowsy. Please take only as directed.   Apply moist heat to the area. Wet a towel and wring it out so it is damp. Put it in the microwave for about 15-20 seconds - long enough to make it hot, but not too hot to apply to your skin causing burns. Do this for about 20-30 minutes, 3-4 times a day.   Perform gentle, light stretches 2-3 times a day.   Put a tennis ball between your back and a wall. Gentle massage the area with rolling the ball around the affected area. Try a foam roller.   Stay well hydrated - try to drink 32-64 oz/day of water.   If you feel like you need a new pillow, try "My Pillow".   Come back and see me if you would like to try a dry needling session. (see below).  I work again on Dec. 26. Or ask for Dr. Leretha PolSantiago.    Trigger Point Dry Needling   What is Trigger Point Dry Needling (DN)?   1. DN is a physical therapy technique used to treat muscle pain and Dysfunction.  Specifically, DN helps deactivate muscle trigger points (Muscle Knots).   2. A thin filiform needle is used to penetrate the skin and stimulate the underlying trigger point.  The goal is for a local twitch response (LTR) to occur and for the trigger point to relax.  No medication of any kind is injected during the procedure.   What Does Trigger Point Dry Needling Feel Like?   1. The procedures feels different for each individual patient.   Some patients report that they do not actually feel the needle enter the skin and overall the process is not painful.  Very mild bleeding may occur.  However, many patients feel a deep cramping in the muscle in which the needle was inserted. This is the local twitch response.    How Will I Feel After The Treatment?   1. Soreness is normal, and the onset of soreness may not occur for a few hours.  Typically this soreness does not last longer than two days.   2. Bruising is uncommon, however; ice can be used to decrease any  possible bruising.   3. In rare cases feeling tired or nauseous after the treatment is normal.  In addition, your symptoms may get worse before they get better, this period will typically not last longer than 24 hours.   What Can I do After My Treatment?   1.  Increase your hydration by drinking more water for the next 24 hours.   2.  You may place ice or heat on the areas treated that have become sore, however don not use heat on inflamed or bruised areas.  Heat often brings more relief post needling.   3. You can continue your regular activities, but vigorous activity is not recommended initially after the treatment for 24 hours.   4. DN is best combined with other physical therapy such as strengthening, stretching, and other therapies.    IF you received an x-ray today, you will receive an invoice from [facility]. Please contact [facility] at [phone] with questions or concerns regarding your invoice.   IF you received labwork today, you will receive an invoice from [facility]. Please contact [facility] at [phone] with questions or concerns regarding your invoice.   Our billing staff will not be able to assist you with questions regarding bills from these companies.  You will be contacted with the lab results as soon as they are available. The fastest way to get your results is to activate your My Chart account. Instructions are located on the last page of this paperwork. If you have not heard from [place] regarding the results in 2 weeks, please contact this office.

## 2018-05-18 NOTE — Progress Notes (Signed)
SOLAN VOSLER  MRN: [id number] DOB: January 12, 1961  PCP: Shade Flood, MD  Subjective:  Pt is a 57 year old male who presents to clinic for back pain x 1 day.  "heavy discomfort" "feelt tight" Mid left. Rotation makes it worse, "takes it from a 7 to a 10 pain"  No MOI.  Denies abdominal pain, fever, chills, dysuria, hematuria, cough, shob Would like a refill of flonase.    Review of Systems  Respiratory: Negative for cough, chest tightness and shortness of breath.   Cardiovascular: Negative for chest pain and palpitations.  Genitourinary: Negative for dysuria, flank pain, frequency, hematuria and urgency.  Musculoskeletal: Positive for back pain. Negative for neck pain and neck stiffness.    Patient Active Problem List   Diagnosis Date Noted  . 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 amLODipine (NORVASC) 10 MG tablet Take 1 tablet (10 mg total) by mouth daily. 90 tablet 2  . cetirizine (ZYRTEC) 10 MG tablet Take 1 tablet (10 mg total) by mouth daily. 30 tablet 11  . fluticasone (FLONASE) 50 MCG/ACT nasal spray Place 2 sprays into both nostrils daily. 48 g 3   No current facility-administered medications on file prior to visit.     Allergies  Allergen Reactions  . Penicillins Hives, Nausea And Vomiting and Swelling     Objective:  BP (!) 149/99 (BP Location: Right Arm, Patient Position: Sitting, Cuff Size: Normal)   Pulse 85   Temp 97.8 F (36.6 C) (Oral)   Resp 16   Ht 5\' 11"  (1.803 m)   Wt 172 lb 3.2 oz (78.1 kg)   SpO2 97%   BMI 24.02 kg/m   Physical Exam Vitals signs reviewed.  Constitutional:      General: He is not in acute distress.    Appearance: Normal appearance. He is not diaphoretic.  Musculoskeletal:     Thoracic back: He exhibits tenderness and pain. He exhibits normal range of motion, no bony tenderness and no spasm.     Lumbar back: He exhibits normal  range of motion, no tenderness and no bony tenderness.       Back:  Neurological:     Mental Status: He is alert.  Psychiatric:        Mood and Affect: Mood normal.        Behavior: Behavior normal.        Thought Content: Thought content normal.     Results for orders placed or performed in visit on 05/18/18  POCT urinalysis dipstick  Result Value Ref Range   Color, UA yellow yellow   Clarity, UA clear clear   Glucose, UA negative negative mg/dL   Bilirubin, UA negative negative   Ketones, POC UA negative negative mg/dL   Spec Grav, UA 4.098 1.191 - 1.025   Blood, UA negative negative   pH, UA 7.0 5.0 - 8.0   Protein Ur, POC negative negative mg/dL   Urobilinogen, UA 0.2 0.2 or 1.0 E.U./dL   Nitrite, UA Negative Negative   Leukocytes, UA Negative Negative  POCT Microscopic Urinalysis ([facility])  Result Value Ref Range   WBC,UR,HPF,POC None None WBC/hpf   RBC,UR,HPF,POC None None RBC/hpf   Bacteria Few (A) None, Too numerous to count   Mucus Absent Absent   Epithelial Cells, UR Per Microscopy None None, Too numerous to count cells/hpf    Assessment and Plan :  1. Acute  left-sided thoracic back pain - Pt endorses left sided mid back pain x 1 day. HPI and PE suggest MSK pain. Not suspicious for kidney stone, UTI, PNU. Plan to treat supportively. Declines dry needle therapy. RTC if no improvement. Consider PT referral - POCT urinalysis dipstick - POCT Microscopic Urinalysis ([facility])  2. Muscle pain - cyclobenzaprine (FLEXERIL) 10 MG tablet; Take 1 tablet (10 mg total) by mouth 3 (three) times daily as needed for muscle spasms.  Dispense: 30 tablet; Refill: 1  3. Allergic rhinitis, unspecified seasonality, unspecified trigger - fluticasone (FLONASE) 50 MCG/ACT nasal spray; Place 2 sprays into both nostrils daily.  Dispense: 48 g; Refill: 3   Whitney Dilia Alemany, PA-C  [facility] at [facility] 05/18/2018 5:39 PM  Please note: Portions of this report may  have been transcribed using dragon voice recognition software. Every effort was made to ensure accuracy; however, inadvertent computerized transcription errors may be present.

## 2018-06-06 ENCOUNTER — Ambulatory Visit (AMBULATORY_SURGERY_CENTER): Payer: Self-pay

## 2018-06-06 VITALS — Ht 70.5 in | Wt 174.4 lb

## 2018-06-06 DIAGNOSIS — Z1211 Encounter for screening for malignant neoplasm of colon: Secondary | ICD-10-CM

## 2018-06-06 MED ORDER — PEG-KCL-NACL-NASULF-NA ASC-C 140 G PO SOLR: 1.0000 | Freq: Once | ORAL | Status: AC

## 2018-06-06 NOTE — Progress Notes (Signed)
Per pt, no allergies to soy or egg products.Pt not taking any weight loss meds or using  O2 at home.  Pt refused emmi video. 

## 2018-06-08 ENCOUNTER — Encounter: Payer: Self-pay | Admitting: Gastroenterology

## 2018-06-15 ENCOUNTER — Other Ambulatory Visit: Payer: Self-pay

## 2018-06-15 ENCOUNTER — Ambulatory Visit (INDEPENDENT_AMBULATORY_CARE_PROVIDER_SITE_OTHER): Payer: Managed Care, Other (non HMO) | Admitting: Family Medicine

## 2018-06-15 ENCOUNTER — Encounter: Payer: Self-pay | Admitting: Family Medicine

## 2018-06-15 VITALS — BP 140/80 | HR 80 | Temp 98.0°F | Ht 70.0 in | Wt 171.6 lb

## 2018-06-15 DIAGNOSIS — Z1329 Encounter for screening for other suspected endocrine disorder: Secondary | ICD-10-CM

## 2018-06-15 DIAGNOSIS — I10 Essential (primary) hypertension: Secondary | ICD-10-CM

## 2018-06-15 DIAGNOSIS — Z0001 Encounter for general adult medical examination with abnormal findings: Secondary | ICD-10-CM | POA: Diagnosis not present

## 2018-06-15 DIAGNOSIS — Z131 Encounter for screening for diabetes mellitus: Secondary | ICD-10-CM | POA: Diagnosis not present

## 2018-06-15 DIAGNOSIS — E781 Pure hyperglyceridemia: Secondary | ICD-10-CM

## 2018-06-15 DIAGNOSIS — Z125 Encounter for screening for malignant neoplasm of prostate: Secondary | ICD-10-CM | POA: Diagnosis not present

## 2018-06-15 DIAGNOSIS — Z0000 Encounter for general adult medical examination without abnormal findings: Secondary | ICD-10-CM

## 2018-06-15 MED ORDER — AMLODIPINE BESYLATE 10 MG PO TABS: 10.0000 mg | ORAL_TABLET | Freq: Every day | ORAL | 2 refills | Status: DC

## 2018-06-15 MED ORDER — HYDROCHLOROTHIAZIDE 12.5 MG PO CAPS: 12.5000 mg | ORAL_CAPSULE | Freq: Every day | ORAL | 1 refills | Status: DC

## 2018-06-15 NOTE — Progress Notes (Signed)
Subjective:    Patient ID: Willie [name], male    DOB: 03-09-1961, 58 y.o.   MRN: [id number]  HPI Willie [name] is a 58 y.o. male Presents today for: Chief Complaint  Patient presents with  . Annual Exam    CPE   History of seasonal allergies, hypertension, GERD.  Hypertension: BP Readings from Last 3 Encounters:  06/15/18 140/80  05/18/18 (!) 149/99  12/12/17 127/82   Lab Results  Component Value Date   CREATININE 1.08 12/12/2017  Takes amlodipine 10 mg daily.  Home BP: usually in 140/90's usually, rare 135. No 120's. Had been on hctz as well a few years ago.  No cp/dyspnea. Rare work HA. No new side effects of meds.  Prior hyperglycemia, but A1c ok last year.   Lipid screening: Lab Results  Component Value Date   CHOL 188 12/12/2017   HDL 45 12/12/2017   LDLCALC 101 (H) 12/12/2017   TRIG 209 (H) 12/12/2017   CHOLHDL 4.2 12/12/2017  The 10-year ASCVD risk score Denman Petra DC Jr., et al., 2013) is: 8.5%   Values used to calculate the score:     Age: 83 years     Sex: Male     Is Non-Hispanic African American: No     Diabetic: No     Tobacco smoker: No     Systolic Blood Pressure: 140 mmHg     Is BP treated: Yes     HDL Cholesterol: 45 mg/dL     Total Cholesterol: 188 mg/dL   GERD: Trigger avoidance. Has used zantac and pepcid as needed only. None needed recently.   Allergic rhinitis: Has used Zyrtec, Flonase as needed. Doing ok. Improved since holidays.   Cancer screening: Colon cancer screening: colonoscopy scheduled next week.  Prostate: No recent PSA - risks/benefits discussed.  Agrees to test today. No FH of prostate CA. Dad with lung CA (pt does not smoke)  Immunization History  Administered Date(s) Administered  . Tdap 12/12/2017  Flu: not yet. Declines.   Depression screen [facility] 2/9 06/15/2018 05/18/2018 12/12/2017 11/10/2017 04/07/2017  Decreased Interest 0 0 0 0 0  Down, Depressed, Hopeless 0 0 0 0 0  PHQ - 2 Score 0 0 0 0 0    Visual Acuity Screening     Right eye Left eye Both eyes  Without correction: 20/50 20/20 20/15   With correction:     no recent eval. Has optho, but has not scheduled.   Dental: no recent eval.  Exercise:2-3 days per week. 1-2 mile walk per day when weather ok. Softball at times.   Patient Active Problem List   Diagnosis Date Noted  . 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 Allergy    seasonal  . GERD (gastroesophageal reflux disease)   . Hypertension    dr Merla Riches  . Umbilical hernia    Past Surgical History:  Procedure Laterality Date  . HERNIA REPAIR  01/2002  . UMBILICAL HERNIA REPAIR  09/01/2011   Procedure: HERNIA REPAIR UMBILICAL ADULT;  Surgeon: Shelly Rubenstein, MD;  Location: [facility] OR;  Service: General;  Laterality: N/A;   Allergies  Allergen Reactions  . Penicillins Hives, Nausea And Vomiting and Swelling   Prior to Admission medications   Medication Sig Start Date End Date Taking? Authorizing Provider  amLODipine (NORVASC) 10 MG tablet Take 1 tablet (10 mg total) by mouth daily. 12/12/17  Yes Shade Flood, MD  cetirizine (ZYRTEC) 10 MG  tablet Take 1 tablet (10 mg total) by mouth daily. 04/07/17  Yes English, Judeth Cornfield D, PA  cyclobenzaprine (FLEXERIL) 10 MG tablet Take 1 tablet (10 mg total) by mouth 3 (three) times daily as needed for muscle spasms. 05/18/18  Yes McVey, Madelaine Bhat, PA-C  diphenhydrAMINE (BENADRYL) 25 MG tablet Take 25 mg by mouth as needed.   Yes [provider]  famotidine (PEPCID) 20 MG tablet Take 20 mg by mouth as needed for heartburn or indigestion.   Yes [provider]  fluticasone (FLONASE) 50 MCG/ACT nasal spray Place 2 sprays into both nostrils daily. 05/18/18  Yes McVey, Madelaine Bhat, PA-C  Multiple Vitamins-Minerals (CENTRUM SILVER 50+MEN PO) Take by mouth daily.   Yes [provider]  Pseudoephedrine-guaiFENesin (MUCINEX D MAX STRENGTH PO) Take by mouth as needed.   Yes  [provider]  ranitidine (ZANTAC) 150 MG tablet Take 150 mg by mouth as needed for heartburn.   Yes [provider]   Social History   Socioeconomic History  . Marital status: Married    Spouse name: Not on file  . Number of children: Not on file  . Years of education: Not on file  . Highest education level: Not on file  Occupational History  . Not on file  Social Needs  . Financial resource strain: Not on file  . Food insecurity:    Worry: Not on file    Inability: Not on file  . Transportation needs:    Medical: Not on file    Non-medical: Not on file  Tobacco Use  . Smoking status: Never Smoker  . Smokeless tobacco: Never Used  Substance and Sexual Activity  . Alcohol use: No  . Drug use: No  . Sexual activity: Yes  Lifestyle  . Physical activity:    Days per week: Not on file    Minutes per session: Not on file  . Stress: Not on file  Relationships  . Social connections:    Talks on phone: Not on file    Gets together: Not on file    Attends religious service: Not on file    Active member of club or organization: Not on file    Attends meetings of clubs or organizations: Not on file    Relationship status: Not on file  . Intimate partner violence:    Fear of current or ex partner: Not on file    Emotionally abused: Not on file    Physically abused: Not on file    Forced sexual activity: Not on file  Other Topics Concern  . Not on file  Social History Narrative  . Not on file    Review of Systems  HENT: Positive for congestion and rhinorrhea.   Eyes: Positive for discharge and visual disturbance.  Musculoskeletal: Positive for back pain (longstanding - no new sx's. better now. ) and neck stiffness (longstanding with disc issue. no new sx's. ).  Allergic/Immunologic: Positive for environmental allergies.  seasonal allergies or cold recently - now better.      Objective:   Physical Exam Vitals signs reviewed.  Constitutional:       Appearance: He is well-developed.  HENT:     Head: Normocephalic and atraumatic.     Right Ear: External ear normal.     Left Ear: External ear normal.  Eyes:     Conjunctiva/sclera: Conjunctivae normal.     Pupils: Pupils are equal, round, and reactive to light.  Neck:     Musculoskeletal:  Normal range of motion and neck supple.     Thyroid: No thyromegaly.  Cardiovascular:     Rate and Rhythm: Normal rate and regular rhythm.     Heart sounds: Normal heart sounds.  Pulmonary:     Effort: Pulmonary effort is normal. No respiratory distress.     Breath sounds: Normal breath sounds. No wheezing.  Abdominal:     General: There is no distension.     Palpations: Abdomen is soft.     Tenderness: There is no abdominal tenderness.     Hernia: There is no hernia in the right inguinal area or left inguinal area.  Genitourinary:    Prostate: Normal.  Musculoskeletal: Normal range of motion.        General: No tenderness.  Lymphadenopathy:     Cervical: No cervical adenopathy.  Skin:    General: Skin is warm and dry.  Neurological:     Mental Status: He is alert and oriented to person, place, and time.     Deep Tendon Reflexes: Reflexes are normal and symmetric.  Psychiatric:        Behavior: Behavior normal.    Vitals:   06/15/18 0802 06/15/18 0813  BP: (!) 150/90 140/80  Pulse: 80   Temp: 98 F (36.7 C)   TempSrc: Oral   SpO2: 99%   Weight: 171 lb 9.6 oz (77.8 kg)   Height: 5\' 10"  (1.778 m)        Assessment & Plan:   Willie [name] is a 58 y.o. male Annual physical exam  - -anticipatory guidance as below in AVS, screening labs above. Health maintenance items as above in HPI discussed/recommended as applicable.   Hypertriglyceridemia - Plan: Lipid panel, Comprehensive metabolic panel  -Borderline ASCVD risk, check labs.  Hold on meds for now  Essential hypertension - Plan: hydrochlorothiazide (MICROZIDE) 12.5 MG capsule, amLODipine (NORVASC) 10 MG tablet  -Decreased  control, add back HCTZ 12.5 mg daily, continue Norvasc.  Monitor for orthostatic or hypotensive symptoms and if those occur return to just amlodipine.  Recheck 6 weeks  Screening for thyroid disorder - Plan: TSH  Screening for diabetes mellitus - Plan: Hemoglobin A1c  We discussed pros and cons of prostate cancer screening, and after this discussion, he chose to have screening done. PSA obtained, and no concerning findings on DRE.   Meds ordered this encounter  Medications  . hydrochlorothiazide (MICROZIDE) 12.5 MG capsule    Sig: Take 1 capsule (12.5 mg total) by mouth daily.    Dispense:  30 capsule    Refill:  1  . amLODipine (NORVASC) 10 MG tablet    Sig: Take 1 tablet (10 mg total) by mouth daily.    Dispense:  90 tablet    Refill:  2   Patient Instructions    Please call and schedule eye doctor and dental visits.   Blood pressure running a little too high.  Can try adding back in hydrochlorothiazide once per day.  Watch for lightheadedness or dizziness which could indicate too much medication.  If that occurs return to just amlodipine.  Follow-up within 6 weeks.  Continue over-the-counter allergy medicines, follow-up if worsening symptoms.  If neck or back pain worsens please return for recheck.  Blood sugar slightly elevated in the past, I will screen for diabetes seen today.  Cholesterol mildly elevated, will repeat testing today.  Thanks for coming in today.  Keeping you healthy  Get these tests  Blood pressure- Have your blood pressure checked  once a year by your healthcare provider.  Normal blood pressure is 120/80  Weight- Have your body mass index (BMI) calculated to screen for obesity.  BMI is a measure of body fat based on height and weight. You can also calculate your own BMI at [email or web address]/.  Cholesterol- Have your cholesterol checked every year.  Diabetes- Have your blood sugar checked regularly if you have high blood pressure, high  cholesterol, have a family history of diabetes or if you are overweight.  Screening for Colon Cancer- Colonoscopy starting at age 57.  Screening may begin sooner depending on your family history and other health conditions. Follow up colonoscopy as directed by your Gastroenterologist.  Screening for Prostate Cancer- Both blood work (PSA) and a rectal exam help screen for Prostate Cancer.  Screening begins at age 57 with African-American men and at age 57 with Caucasian men.  Screening may begin sooner depending on your family history.  Take these medicines  Aspirin- One aspirin daily can help prevent Heart disease and Stroke.  Flu shot- Every fall.  Tetanus- Every 10 years.  Zostavax- Once after the age of 57 to prevent Shingles.  Pneumonia shot- Once after the age of 57; if you are younger than 1265, ask your healthcare provider if you need a Pneumonia shot.  Take these steps  Don't smoke- If you do smoke, talk to your doctor about quitting.  For tips on how to quit, go to www.smokefree.gov or call 1-800-QUIT-NOW.  Be physically active- Exercise 5 days a week for at least 30 minutes.  If you are not already physically active start slow and gradually work up to 30 minutes of moderate physical activity.  Examples of moderate activity include walking briskly, mowing the yard, dancing, swimming, bicycling, etc.  Eat a healthy diet- Eat a variety of healthy food such as fruits, vegetables, low fat milk, low fat cheese, yogurt, lean meant, poultry, fish, beans, tofu, etc. For more information go to www.thenutritionsource.org  Drink alcohol in moderation- Limit alcohol intake to less than two drinks a day. Never drink and drive.  Dentist- Brush and floss twice daily; visit your dentist twice a year.  Depression- Your emotional health is as important as your physical health. If you're feeling down, or losing interest in things you would normally enjoy please talk to your healthcare  provider.  Eye exam- Visit your eye doctor every year.  Safe sex- If you may be exposed to a sexually transmitted infection, use a condom.  Seat belts- Seat belts can save your life; always wear one.  Smoke/Carbon Monoxide detectors- These detectors need to be installed on the appropriate level of your home.  Replace batteries at least once a year.  Skin cancer- When out in the sun, cover up and use sunscreen 15 SPF or higher.  Violence- If anyone is threatening you, please tell your healthcare provider.  Living Will/ Health care power of attorney- Speak with your healthcare provider and family.  If you have lab work done today you will be contacted with your lab results within the next 2 weeks.  If you have not heard from us then please contact us. The fastest way to get your results is to register for My Chart.   IF you received an x-ray today, you will receive an invoice from [facility]. Please contact [facility] at [phone] with questions or concerns regarding your invoice.   IF you received labwork today, you will receive an invoice from [facility]. Please contact [facility] at  [phone] with questions or concerns regarding your invoice.   Our billing staff will not be able to assist you with questions regarding bills from these companies.  You will be contacted with the lab results as soon as they are available. The fastest way to get your results is to activate your My Chart account. Instructions are located on the last page of this paperwork. If you have not heard from [place] regarding the results in 2 weeks, please contact this office.       Signed,   Meredith Staggers, MD Primary Care at [facility].  06/15/18 8:41 AM

## 2018-06-15 NOTE — Patient Instructions (Addendum)
Please call and schedule eye doctor and dental visits.   Blood pressure running a little too high.  Can try adding back in hydrochlorothiazide once per day.  Watch for lightheadedness or dizziness which could indicate too much medication.  If that occurs return to just amlodipine.  Follow-up within 6 weeks.  Continue over-the-counter allergy medicines, follow-up if worsening symptoms.  If neck or back pain worsens please return for recheck.  Blood sugar slightly elevated in the past, I will screen for diabetes seen today.  Cholesterol mildly elevated, will repeat testing today.  Thanks for coming in today.  Keeping you healthy  Get these tests  Blood pressure- Have your blood pressure checked once a year by your healthcare provider.  Normal blood pressure is 120/80  Weight- Have your body mass index (BMI) calculated to screen for obesity.  BMI is a measure of body fat based on height and weight. You can also calculate your own BMI at [email or web address].  Cholesterol- Have your cholesterol checked every year.  Diabetes- Have your blood sugar checked regularly if you have high blood pressure, high cholesterol, have a family history of diabetes or if you are overweight.  Screening for Colon Cancer- Colonoscopy starting at age 61.  Screening may begin sooner depending on your family history and other health conditions. Follow up colonoscopy as directed by your Gastroenterologist.  Screening for Prostate Cancer- Both blood work (PSA) and a rectal exam help screen for Prostate Cancer.  Screening begins at age 33 with African-American men and at age 56 with Caucasian men.  Screening may begin sooner depending on your family history.  Take these medicines  Aspirin- One aspirin daily can help prevent Heart disease and Stroke.  Flu shot- Every fall.  Tetanus- Every 10 years.  Zostavax- Once after the age of 41 to prevent Shingles.  Pneumonia shot- Once after the age of 82; if  you are younger than 76, ask your healthcare provider if you need a Pneumonia shot.  Take these steps  Don't smoke- If you do smoke, talk to your doctor about quitting.  For tips on how to quit, go to www.smokefree.gov or call 1-800-QUIT-NOW.  Be physically active- Exercise 5 days a week for at least 30 minutes.  If you are not already physically active start slow and gradually work up to 30 minutes of moderate physical activity.  Examples of moderate activity include walking briskly, mowing the yard, dancing, swimming, bicycling, etc.  Eat a healthy diet- Eat a variety of healthy food such as fruits, vegetables, low fat milk, low fat cheese, yogurt, lean meant, poultry, fish, beans, tofu, etc. For more information go to www.thenutritionsource.org  Drink alcohol in moderation- Limit alcohol intake to less than two drinks a day. Never drink and drive.  Dentist- Brush and floss twice daily; visit your dentist twice a year.  Depression- Your emotional health is as important as your physical health. If you're feeling down, or losing interest in things you would normally enjoy please talk to your healthcare provider.  Eye exam- Visit your eye doctor every year.  Safe sex- If you may be exposed to a sexually transmitted infection, use a condom.  Seat belts- Seat belts can save your life; always wear one.  Smoke/Carbon Monoxide detectors- These detectors need to be installed on the appropriate level of your home.  Replace batteries at least once a year.  Skin cancer- When out in the sun, cover up and use sunscreen 15 SPF or higher.  Violence- If  anyone is threatening you, please tell your healthcare provider.  Living Will/ Health care power of attorney- Speak with your healthcare provider and family.  If you have lab work done today you will be contacted with your lab results within the next 2 weeks.  If you have not heard from [place] then please contact us. The fastest way to get your results is  to register for My Chart.   IF you received an x-ray today, you will receive an invoice from [facility]. Please contact [facility] at [phone] with questions or concerns regarding your invoice.   IF you received labwork today, you will receive an invoice from [place]. Please contact [facility] at [phone] with questions or concerns regarding your invoice.   Our billing staff will not be able to assist you with questions regarding bills from these companies.  You will be contacted with the lab results as soon as they are available. The fastest way to get your results is to activate your My Chart account. Instructions are located on the last page of this paperwork. If you have not heard from [place] regarding the results in 2 weeks, please contact this office.

## 2018-06-16 LAB — LIPID PANEL
CHOLESTEROL TOTAL: 218 mg/dL — AB (ref 100–199)
Chol/HDL Ratio: 5.5 ratio — ABNORMAL HIGH (ref 0.0–5.0)
HDL: 40 mg/dL (ref >39)
LDL Calculated: 108 mg/dL — ABNORMAL HIGH (ref 0–99)
Triglycerides: 352 mg/dL — ABNORMAL HIGH (ref 0–149)
VLDL Cholesterol Cal: 70 mg/dL — ABNORMAL HIGH (ref 5–40)

## 2018-06-16 LAB — COMPREHENSIVE METABOLIC PANEL
ALT: 41 IU/L (ref 0–44)
AST: 22 IU/L (ref 0–40)
Albumin/Globulin Ratio: 1.6 (ref 1.2–2.2)
Albumin: 4.4 g/dL (ref 3.5–5.5)
Alkaline Phosphatase: 84 IU/L (ref 39–117)
BUN/Creatinine Ratio: 13 (ref 9–20)
BUN: 15 mg/dL (ref 6–24)
Bilirubin Total: 0.4 mg/dL (ref 0.0–1.2)
CO2: 23 mmol/L (ref 20–29)
CREATININE: 1.2 mg/dL (ref 0.76–1.27)
Calcium: 9.5 mg/dL (ref 8.7–10.2)
Chloride: 102 mmol/L (ref 96–106)
GFR calc Af Amer: 78 mL/min/{1.73_m2} (ref >59)
GFR calc non Af Amer: 67 mL/min/{1.73_m2} (ref >59)
Globulin, Total: 2.8 g/dL (ref 1.5–4.5)
Glucose: 110 mg/dL — ABNORMAL HIGH (ref 65–99)
Potassium: 4.2 mmol/L (ref 3.5–5.2)
Sodium: 143 mmol/L (ref 134–144)
TOTAL PROTEIN: 7.2 g/dL (ref 6.0–8.5)

## 2018-06-16 LAB — HEMOGLOBIN A1C
Est. average glucose Bld gHb Est-mCnc: 114 mg/dL
Hgb A1c MFr Bld: 5.6 % (ref 4.8–5.6)

## 2018-06-16 LAB — PSA: Prostate Specific Ag, Serum: 2.3 ng/mL (ref 0.0–4.0)

## 2018-06-16 LAB — TSH: TSH: 1.34 u[IU]/mL (ref 0.450–4.500)

## 2018-06-20 ENCOUNTER — Encounter: Payer: Self-pay | Admitting: Gastroenterology

## 2018-06-20 ENCOUNTER — Ambulatory Visit (AMBULATORY_SURGERY_CENTER): Payer: Managed Care, Other (non HMO) | Admitting: Gastroenterology

## 2018-06-20 VITALS — BP 120/84 | HR 73 | Temp 99.6°F | Resp 12 | Ht 70.0 in | Wt 174.0 lb

## 2018-06-20 DIAGNOSIS — Z1211 Encounter for screening for malignant neoplasm of colon: Secondary | ICD-10-CM | POA: Diagnosis present

## 2018-06-20 MED ORDER — SODIUM CHLORIDE 0.9 % IV SOLN: 500.0000 mL | Freq: Once | INTRAVENOUS | Status: DC

## 2018-06-20 NOTE — Progress Notes (Signed)
PT taken to PACU. Monitors in place. VSS. Report given to RN. 

## 2018-06-20 NOTE — Op Note (Signed)
[facility] Patient Name: Willie [name] Procedure Date: 06/20/2018 10:53 AM MRN: [id number] Endoscopist: Sherilyn Cooter L. Myrtie Neither , MD Age: 58 Referring MD:  Date of Birth: [birth year] Gender: Male Account #: [id number] Procedure:                Colonoscopy Indications:              Screening for colorectal malignant neoplasm, This                            is the patient's first colonoscopy Medicines:                Monitored Anesthesia Care Procedure:                Pre-Anesthesia Assessment:                           - Prior to the procedure, a History and Physical                            was performed, and patient medications and                            allergies were reviewed. The patient's tolerance of                            previous anesthesia was also reviewed. The risks                            and benefits of the procedure and the sedation                            options and risks were discussed with the patient.                            All questions were answered, and informed consent                            was obtained. Prior Anticoagulants: The patient has                            taken no previous anticoagulant or antiplatelet                            agents. ASA Grade Assessment: II - A patient with                            mild systemic disease. After reviewing the risks                            and benefits, the patient was deemed in                            satisfactory condition to undergo the procedure.  After obtaining informed consent, the colonoscope                            was passed under direct vision. Throughout the                            procedure, the patient's blood pressure, pulse, and                            oxygen saturations were monitored continuously. The                            Colonoscope was introduced through the anus and                            advanced to the the cecum,  identified by                            appendiceal orifice and ileocecal valve. The                            colonoscopy was performed without difficulty. The                            patient tolerated the procedure well. The quality                            of the bowel preparation was good. The ileocecal                            valve, appendiceal orifice, and rectum were                            photographed. Scope In: 11:02:05 AM Scope Out: 11:17:26 AM Scope Withdrawal Time: 0 hours 11 minutes 13 seconds  Total Procedure Duration: 0 hours 15 minutes 21 seconds  Findings:                 The perianal and digital rectal examinations were                            normal.                           The entire examined colon appeared normal on direct                            and retroflexion views. Complications:            No immediate complications. Estimated Blood Loss:     Estimated blood loss: none. Impression:               - The entire examined colon is normal on direct and                            retroflexion views.                           -  No specimens collected. Recommendation:           - Patient has a contact number available for                            emergencies. The signs and symptoms of potential                            delayed complications were discussed with the                            patient. Return to normal activities tomorrow.                            Written discharge instructions were provided to the                            patient.                           - Resume previous diet.                           - Continue present medications.                           - Repeat colonoscopy in 10 years for screening                            purposes. Henry L. Myrtie Neitheranis, MD 06/20/2018 11:25:28 AM This report has been signed electronically.

## 2018-06-20 NOTE — Patient Instructions (Signed)
Thank you for allowing us to care for you today!  Resume previous diet and medications today.  Return to normal activities tomorrow.  Next screening colonoscopy in 10 years.   YOU HAD AN ENDOSCOPIC PROCEDURE TODAY AT THE [facility]:   Refer to the procedure report that was given to you for any specific questions about what was found during the examination.  If the procedure report does not answer your questions, please call your gastroenterologist to clarify.  If you requested that your care partner not be given the details of your procedure findings, then the procedure report has been included in a sealed envelope for you to review at your convenience later.  YOU SHOULD EXPECT: Some feelings of bloating in the abdomen. Passage of more gas than usual.  Walking can help get rid of the air that was put into your GI tract during the procedure and reduce the bloating. If you had a lower endoscopy (such as a colonoscopy or flexible sigmoidoscopy) you may notice spotting of blood in your stool or on the toilet paper. If you underwent a bowel prep for your procedure, you may not have a normal bowel movement for a few days.  Please Note:  You might notice some irritation and congestion in your nose or some drainage.  This is from the oxygen used during your procedure.  There is no need for concern and it should clear up in a day or so.  SYMPTOMS TO REPORT IMMEDIATELY:   Following lower endoscopy (colonoscopy or flexible sigmoidoscopy):  Excessive amounts of blood in the stool  Significant tenderness or worsening of abdominal pains  Swelling of the abdomen that is new, acute  Fever of 100F or higher    For urgent or emergent issues, a gastroenterologist can be reached at any hour by calling [phone].   DIET:  We do recommend a small meal at first, but then you may proceed to your regular diet.  Drink plenty of fluids but you should avoid alcoholic beverages for 24  hours.  ACTIVITY:  You should plan to take it easy for the rest of today and you should NOT DRIVE or use heavy machinery until tomorrow (because of the sedation medicines used during the test).    FOLLOW UP: Our staff will call the number listed on your records the next business day following your procedure to check on you and address any questions or concerns that you may have regarding the information given to you following your procedure. If we do not reach you, we will leave a message.  However, if you are feeling well and you are not experiencing any problems, there is no need to return our call.  We will assume that you have returned to your regular daily activities without incident.  If any biopsies were taken you will be contacted by phone or by letter within the next 1-3 weeks.  Please call us at [phone] if you have not heard about the biopsies in 3 weeks.    SIGNATURES/CONFIDENTIALITY: You and/or your care partner have signed paperwork which will be entered into your electronic medical record.  These signatures attest to the fact that that the information above on your After Visit Summary has been reviewed and is understood.  Full responsibility of the confidentiality of this discharge information lies with you and/or your care-partner. 

## 2018-06-20 NOTE — Progress Notes (Signed)
Pt's states no medical or surgical changes since previsit or office visit. 

## 2018-06-21 ENCOUNTER — Telehealth: Payer: Self-pay | Admitting: *Deleted

## 2018-06-21 ENCOUNTER — Telehealth: Payer: Self-pay

## 2018-06-21 NOTE — Telephone Encounter (Signed)
  Follow up Call-  Call back number 06/20/2018  Post procedure Call Back phone  # [phone]  Permission to leave phone message Yes  Some recent data might be hidden     Patient questions:  Message left to call us if necessary.

## 2018-06-21 NOTE — Telephone Encounter (Signed)
  Follow up Call-  Call back number 06/20/2018  Post procedure Call Back phone  # [phone]  Permission to leave phone message Yes  Some recent data might be hidden     Patient questions:  Do you have a fever, pain , or abdominal swelling? No. Pain Score  0 *  Have you tolerated food without any problems? Yes.    Have you been able to return to your normal activities? Yes.    Do you have any questions about your discharge instructions: Diet   No. Medications  No. Follow up visit  No.  Do you have questions or concerns about your Care? No.  Actions: * If pain score is 4 or above: No action needed, pain <4.

## 2018-07-27 ENCOUNTER — Ambulatory Visit (INDEPENDENT_AMBULATORY_CARE_PROVIDER_SITE_OTHER): Payer: Managed Care, Other (non HMO) | Admitting: Family Medicine

## 2018-07-27 ENCOUNTER — Other Ambulatory Visit: Payer: Self-pay

## 2018-07-27 VITALS — BP 142/84 | HR 81 | Temp 97.8°F | Resp 14 | Ht 70.0 in | Wt 169.4 lb

## 2018-07-27 DIAGNOSIS — T7840XA Allergy, unspecified, initial encounter: Secondary | ICD-10-CM

## 2018-07-27 DIAGNOSIS — I10 Essential (primary) hypertension: Secondary | ICD-10-CM

## 2018-07-27 NOTE — Patient Instructions (Addendum)
It is difficult to say whether the hydrochlorothiazide could have been the cause of the suspected allergic reaction.  Other food or substance could have caused that as well especially as you had been on that medicine in the past and had been on it for approximately a month.  I think it is reasonable at this time to avoid any further hydrochlorothiazide at this point, monitor home blood pressures, see information below on hypertension and bring copy of numbers into office visit in 6 weeks.  If blood pressure is running over 160 on top number or over 100 on bottom number, return sooner so we can adjust medications.  Thank you for coming in today.   Hypertension Hypertension, commonly called high blood pressure, is when the force of blood pumping through the arteries is too strong. The arteries are the blood vessels that carry blood from the heart throughout the body. Hypertension forces the heart to work harder to pump blood and may cause arteries to become narrow or stiff. Having untreated or uncontrolled hypertension can cause heart attacks, strokes, kidney disease, and other problems. A blood pressure reading consists of a higher number over a lower number. Ideally, your blood pressure should be below 120/80. The first ("top") number is called the systolic pressure. It is a measure of the pressure in your arteries as your heart beats. The second ("bottom") number is called the diastolic pressure. It is a measure of the pressure in your arteries as the heart relaxes. What are the causes? The cause of this condition is not known. What increases the risk? Some risk factors for high blood pressure are under your control. Others are not. Factors you can change  Smoking.  Having type 2 diabetes mellitus, high cholesterol, or both.  Not getting enough exercise or physical activity.  Being overweight.  Having too much fat, sugar, calories, or salt (sodium) in your diet.  Drinking too much  alcohol. Factors that are difficult or impossible to change  Having chronic kidney disease.  Having a family history of high blood pressure.  Age. Risk increases with age.  Race. You may be at higher risk if you are African-American.  Gender. Men are at higher risk than women before age 38. After age 6, women are at higher risk than men.  Having obstructive sleep apnea.  Stress. What are the signs or symptoms? Extremely high blood pressure (hypertensive crisis) may cause:  Headache.  Anxiety.  Shortness of breath.  Nosebleed.  Nausea and vomiting.  Severe chest pain.  Jerky movements you cannot control (seizures). How is this diagnosed? This condition is diagnosed by measuring your blood pressure while you are seated, with your arm resting on a surface. The cuff of the blood pressure monitor will be placed directly against the skin of your upper arm at the level of your heart. It should be measured at least twice using the same arm. Certain conditions can cause a difference in blood pressure between your right and left arms. Certain factors can cause blood pressure readings to be lower or higher than normal (elevated) for a short period of time:  When your blood pressure is higher when you are in a health care provider's office than when you are at home, this is called white coat hypertension. Most people with this condition do not need medicines.  When your blood pressure is higher at home than when you are in a health care provider's office, this is called masked hypertension. Most people with this condition  may need medicines to control blood pressure. If you have a high blood pressure reading during one visit or you have normal blood pressure with other risk factors:  You may be asked to return on a different day to have your blood pressure checked again.  You may be asked to monitor your blood pressure at home for 1 week or longer. If you are diagnosed with  hypertension, you may have other blood or imaging tests to help your health care provider understand your overall risk for other conditions. How is this treated? This condition is treated by making healthy lifestyle changes, such as eating healthy foods, exercising more, and reducing your alcohol intake. Your health care provider may prescribe medicine if lifestyle changes are not enough to get your blood pressure under control, and if:  Your systolic blood pressure is above 130.  Your diastolic blood pressure is above 80. Your personal target blood pressure may vary depending on your medical conditions, your age, and other factors. Follow these instructions at home: Eating and drinking   Eat a diet that is high in fiber and potassium, and low in sodium, added sugar, and fat. An example eating plan is called the DASH (Dietary Approaches to Stop Hypertension) diet. To eat this way: ? Eat plenty of fresh fruits and vegetables. Try to fill half of your plate at each meal with fruits and vegetables. ? Eat whole grains, such as whole wheat pasta, brown rice, or whole grain bread. Fill about one quarter of your plate with whole grains. ? Eat or drink low-fat dairy products, such as skim milk or low-fat yogurt. ? Avoid fatty cuts of meat, processed or cured meats, and poultry with skin. Fill about one quarter of your plate with lean proteins, such as fish, chicken without skin, beans, eggs, and tofu. ? Avoid premade and processed foods. These tend to be higher in sodium, added sugar, and fat.  Reduce your daily sodium intake. Most people with hypertension should eat less than 1,500 mg of sodium a day.  Limit alcohol intake to no more than 1 drink a day for nonpregnant women and 2 drinks a day for men. One drink equals 12 oz of beer, 5 oz of wine, or 1 oz of hard liquor. Lifestyle   Work with your health care provider to maintain a healthy body weight or to lose weight. Ask what an ideal weight is  for you.  Get at least 30 minutes of exercise that causes your heart to beat faster (aerobic exercise) most days of the week. Activities may include walking, swimming, or biking.  Include exercise to strengthen your muscles (resistance exercise), such as pilates or lifting weights, as part of your weekly exercise routine. Try to do these types of exercises for 30 minutes at least 3 days a week.  Do not use any products that contain nicotine or tobacco, such as cigarettes and e-cigarettes. If you need help quitting, ask your health care provider.  Monitor your blood pressure at home as told by your health care provider.  Keep all follow-up visits as told by your health care provider. This is important. Medicines  Take over-the-counter and prescription medicines only as told by your health care provider. Follow directions carefully. Blood pressure medicines must be taken as prescribed.  Do not skip doses of blood pressure medicine. Doing this puts you at risk for problems and can make the medicine less effective.  Ask your health care provider about side effects or reactions to  medicines that you should watch for. Contact a health care provider if:  You think you are having a reaction to a medicine you are taking.  You have headaches that keep coming back (recurring).  You feel dizzy.  You have swelling in your ankles.  You have trouble with your vision. Get help right away if:  You develop a severe headache or confusion.  You have unusual weakness or numbness.  You feel faint.  You have severe pain in your chest or abdomen.  You vomit repeatedly.  You have trouble breathing. Summary  Hypertension is when the force of blood pumping through your arteries is too strong. If this condition is not controlled, it may put you at risk for serious complications.  Your personal target blood pressure may vary depending on your medical conditions, your age, and other factors. For most  people, a normal blood pressure is less than 120/80.  Hypertension is treated with lifestyle changes, medicines, or a combination of both. Lifestyle changes include weight loss, eating a healthy, low-sodium diet, exercising more, and limiting alcohol. This information is not intended to replace advice given to you by your health care provider. Make sure you discuss any questions you have with your health care provider. Document Released: 05/24/2005 Document Revised: 04/21/2016 Document Reviewed: 04/21/2016 Elsevier Interactive Patient Education  [employer]2019 [employer]   If you have lab work done today you will be contacted with your lab results within the next 2 weeks.  If you have not heard from us then please contact us. The fastest way to get your results is to register for My Chart.   IF you received an x-ray today, you will receive an invoice from [facility]. Please contact [facility] at [phone] with questions or concerns regarding your invoice.   IF you received labwork today, you will receive an invoice from [facility]. Please contact [facility] at [phone] with questions or concerns regarding your invoice.   Our billing staff will not be able to assist you with questions regarding bills from these companies.  You will be contacted with the lab results as soon as they are available. The fastest way to get your results is to activate your My Chart account. Instructions are located on the last page of this paperwork. If you have not heard from us regarding the results in 2 weeks, please contact this office.

## 2018-07-27 NOTE — Progress Notes (Signed)
Subjective:    Patient ID: Willie [name], male    DOB: 10/16/1960, 58 y.o.   MRN: [id number]  HPI Willie [name] is a 58 y.o. male Presents today for: Chief Complaint  Patient presents with  . Hypertension    was seen here 06/15/18 bp was running a little at that visit. Added the hctz. Midway through taking hctz both eye started swell. was put on prednisone and the swelling went down bu the weekend Dr. Jerilynn SomStill feels like something is in the right eye, not sure if this is related to the hctz.     Hypertension: BP Readings from Last 3 Encounters:  07/27/18 (!) 142/84  06/20/18 120/84  06/15/18 140/80   Lab Results  Component Value Date   CREATININE 1.20 06/15/2018   Last seen in January.  Blood pressure was running a little bit higher at that time, restarted hydrochlorothiazide 12.5 mg daily, continue amlodipine 10 mg daily.  He was continue to use Zyrtec and Flonase for allergy symptoms at that time.  He reports that since last visit he did have some swelling of his eyes on feb 8th. Had been on hctz in past as well, but pinpoint itchy rash on thighs and swelling around both eyes (phone pic reviewed - swelling with some erythema above and below eyes bilterally,  Feb 8th). Treated with prednisone after visit with [facility] - 12 taper. Finished yesterday. Stopped HCTZ about 10 days ago. Swelling resolved. Never had tongue/lip swelling, stridor or wheeze/dyspnea. Rash resolved.   Home BP: 135-140/85-89 on amlodipine 10mg .   Patient Active Problem List   Diagnosis Date Noted  . 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 Allergy    seasonal  . GERD (gastroesophageal reflux disease)   . Hypertension    dr Merla Richesdoolittle  . Umbilical hernia    Past Surgical History:  Procedure Laterality Date  . HERNIA REPAIR  01/2002  . UMBILICAL HERNIA REPAIR  09/01/2011   Procedure: HERNIA REPAIR UMBILICAL ADULT;  Surgeon: Shelly Rubensteinouglas A Blackman, MD;  Location: [facility] OR;   Service: General;  Laterality: N/A;   Allergies  Allergen Reactions  . Penicillins Hives, Nausea And Vomiting and Swelling   Prior to Admission medications   Medication Sig Start Date End Date Taking? Authorizing Provider  amLODipine (NORVASC) 10 MG tablet Take 1 tablet (10 mg total) by mouth daily. 06/15/18   Shade FloodGreene, Zanyia Silbaugh R, MD  cetirizine (ZYRTEC) 10 MG tablet Take 1 tablet (10 mg total) by mouth daily. 04/07/17   Trena PlattEnglish, Stephanie D, PA  cyclobenzaprine (FLEXERIL) 10 MG tablet Take 1 tablet (10 mg total) by mouth 3 (three) times daily as needed for muscle spasms. 05/18/18   McVey, Madelaine BhatElizabeth Whitney, PA-C  diphenhydrAMINE (BENADRYL) 25 MG tablet Take 25 mg by mouth as needed.    [provider]  famotidine (PEPCID) 20 MG tablet Take 20 mg by mouth as needed for heartburn or indigestion.    [provider]  fluticasone (FLONASE) 50 MCG/ACT nasal spray Place 2 sprays into both nostrils daily. 05/18/18   McVey, Madelaine BhatElizabeth Whitney, PA-C  hydrochlorothiazide (MICROZIDE) 12.5 MG capsule Take 1 capsule (12.5 mg total) by mouth daily. 06/15/18   Shade FloodGreene, Hayes Rehfeldt R, MD  Multiple Vitamins-Minerals (CENTRUM SILVER 50+MEN PO) Take by mouth daily.    [provider]  Pseudoephedrine-guaiFENesin (MUCINEX D MAX STRENGTH PO) Take by mouth as needed.    [provider]  ranitidine (ZANTAC) 150 MG tablet Take 150  mg by mouth as needed for heartburn.    [provider]   Social History   Socioeconomic History  . Marital status: Married    Spouse name: Not on file  . Number of children: Not on file  . Years of education: Not on file  . Highest education level: Not on file  Occupational History  . Not on file  Social Needs  . Financial resource strain: Not on file  . Food insecurity:    Worry: Not on file    Inability: Not on file  . Transportation needs:    Medical: Not on file    Non-medical: Not on file  Tobacco Use  . Smoking status: Never Smoker  .  Smokeless tobacco: Never Used  Substance and Sexual Activity  . Alcohol use: No  . Drug use: No  . Sexual activity: Yes  Lifestyle  . Physical activity:    Days per week: Not on file    Minutes per session: Not on file  . Stress: Not on file  Relationships  . Social connections:    Talks on phone: Not on file    Gets together: Not on file    Attends religious service: Not on file    Active member of club or organization: Not on file    Attends meetings of clubs or organizations: Not on file    Relationship status: Not on file  . Intimate partner violence:    Fear of current or ex partner: Not on file    Emotionally abused: Not on file    Physically abused: Not on file    Forced sexual activity: Not on file  Other Topics Concern  . Not on file  Social History Narrative  . Not on file    Review of Systems  Constitutional: Negative for fatigue and unexpected weight change.  Eyes: Negative for visual disturbance.  Respiratory: Negative for cough, chest tightness and shortness of breath.   Cardiovascular: Negative for chest pain, palpitations and leg swelling.  Neurological: Negative for dizziness, light-headedness and headaches.       Objective:   Physical Exam Vitals signs reviewed.  Constitutional:      Appearance: He is well-developed.  HENT:     Head: Normocephalic and atraumatic.  Eyes:     Pupils: Pupils are equal, round, and reactive to light.  Neck:     Vascular: No carotid bruit or JVD.  Cardiovascular:     Rate and Rhythm: Normal rate and regular rhythm.     Heart sounds: Normal heart sounds. No murmur.  Pulmonary:     Effort: Pulmonary effort is normal.     Breath sounds: Normal breath sounds. No rales.  Skin:    General: Skin is warm and dry.  Neurological:     Mental Status: He is alert and oriented to person, place, and time.    Vitals:   07/27/18 0934 07/27/18 0940  BP: (!) 152/98 (!) 142/84  Pulse: 81   Resp: 14   Temp: 97.8 F (36.6 C)     TempSrc: Oral   SpO2: 97%   Weight: 169 lb 6.4 oz (76.8 kg)   Height: 5\' 10"  (1.778 m)       Assessment & Plan:   Willie [name] is a 58 y.o. male Essential hypertension  Allergic reaction, initial encounter  Less likely reaction to HCTZ as he had been on that in the past and had been taking it for a month already.  Possible  other allergen, but now improving after cessation of HCTZ and use of prednisone.   -Borderline blood pressure, but decided to avoid new medications for now, continue amlodipine 10 mg daily, monitor home readings, handout given on hypertension and recheck 6 weeks.  RTC precautions if significant elevated readings in the meantime, or evidence of reaction off prednisone.  No orders of the defined types were placed in this encounter.  Patient Instructions    It is difficult to say whether the hydrochlorothiazide could have been the cause of the suspected allergic reaction.  Other food or substance could have caused that as well especially as you had been on that medicine in the past and had been on it for approximately a month.  I think it is reasonable at this time to avoid any further hydrochlorothiazide at this point, monitor home blood pressures, see information below on hypertension and bring copy of numbers into office visit in 6 weeks.  If blood pressure is running over 160 on top number or over 100 on bottom number, return sooner so we can adjust medications.  Thank you for coming in today.   Hypertension Hypertension, commonly called high blood pressure, is when the force of blood pumping through the arteries is too strong. The arteries are the blood vessels that carry blood from the heart throughout the body. Hypertension forces the heart to work harder to pump blood and may cause arteries to become narrow or stiff. Having untreated or uncontrolled hypertension can cause heart attacks, strokes, kidney disease, and other problems. A blood pressure reading consists  of a higher number over a lower number. Ideally, your blood pressure should be below 120/80. The first ("top") number is called the systolic pressure. It is a measure of the pressure in your arteries as your heart beats. The second ("bottom") number is called the diastolic pressure. It is a measure of the pressure in your arteries as the heart relaxes. What are the causes? The cause of this condition is not known. What increases the risk? Some risk factors for high blood pressure are under your control. Others are not. Factors you can change  Smoking.  Having type 2 diabetes mellitus, high cholesterol, or both.  Not getting enough exercise or physical activity.  Being overweight.  Having too much fat, sugar, calories, or salt (sodium) in your diet.  Drinking too much alcohol. Factors that are difficult or impossible to change  Having chronic kidney disease.  Having a family history of high blood pressure.  Age. Risk increases with age.  Race. You may be at higher risk if you are African-American.  Gender. Men are at higher risk than women before age 42. After age 9, women are at higher risk than men.  Having obstructive sleep apnea.  Stress. What are the signs or symptoms? Extremely high blood pressure (hypertensive crisis) may cause:  Headache.  Anxiety.  Shortness of breath.  Nosebleed.  Nausea and vomiting.  Severe chest pain.  Jerky movements you cannot control (seizures). How is this diagnosed? This condition is diagnosed by measuring your blood pressure while you are seated, with your arm resting on a surface. The cuff of the blood pressure monitor will be placed directly against the skin of your upper arm at the level of your heart. It should be measured at least twice using the same arm. Certain conditions can cause a difference in blood pressure between your right and left arms. Certain factors can cause blood pressure readings to be lower or  higher than  normal (elevated) for a short period of time:  When your blood pressure is higher when you are in a health care provider's office than when you are at home, this is called white coat hypertension. Most people with this condition do not need medicines.  When your blood pressure is higher at home than when you are in a health care provider's office, this is called masked hypertension. Most people with this condition may need medicines to control blood pressure. If you have a high blood pressure reading during one visit or you have normal blood pressure with other risk factors:  You may be asked to return on a different day to have your blood pressure checked again.  You may be asked to monitor your blood pressure at home for 1 week or longer. If you are diagnosed with hypertension, you may have other blood or imaging tests to help your health care provider understand your overall risk for other conditions. How is this treated? This condition is treated by making healthy lifestyle changes, such as eating healthy foods, exercising more, and reducing your alcohol intake. Your health care provider may prescribe medicine if lifestyle changes are not enough to get your blood pressure under control, and if:  Your systolic blood pressure is above 130.  Your diastolic blood pressure is above 80. Your personal target blood pressure may vary depending on your medical conditions, your age, and other factors. Follow these instructions at home: Eating and drinking   Eat a diet that is high in fiber and potassium, and low in sodium, added sugar, and fat. An example eating plan is called the DASH (Dietary Approaches to Stop Hypertension) diet. To eat this way: ? Eat plenty of fresh fruits and vegetables. Try to fill half of your plate at each meal with fruits and vegetables. ? Eat whole grains, such as whole wheat pasta, brown rice, or whole grain bread. Fill about one quarter of your plate with whole  grains. ? Eat or drink low-fat dairy products, such as skim milk or low-fat yogurt. ? Avoid fatty cuts of meat, processed or cured meats, and poultry with skin. Fill about one quarter of your plate with lean proteins, such as fish, chicken without skin, beans, eggs, and tofu. ? Avoid premade and processed foods. These tend to be higher in sodium, added sugar, and fat.  Reduce your daily sodium intake. Most people with hypertension should eat less than 1,500 mg of sodium a day.  Limit alcohol intake to no more than 1 drink a day for nonpregnant women and 2 drinks a day for men. One drink equals 12 oz of beer, 5 oz of wine, or 1 oz of hard liquor. Lifestyle   Work with your health care provider to maintain a healthy body weight or to lose weight. Ask what an ideal weight is for you.  Get at least 30 minutes of exercise that causes your heart to beat faster (aerobic exercise) most days of the week. Activities may include walking, swimming, or biking.  Include exercise to strengthen your muscles (resistance exercise), such as pilates or lifting weights, as part of your weekly exercise routine. Try to do these types of exercises for 30 minutes at least 3 days a week.  Do not use any products that contain nicotine or tobacco, such as cigarettes and e-cigarettes. If you need help quitting, ask your health care provider.  Monitor your blood pressure at home as told by your health care provider.  Keep all follow-up visits as told by your health care provider. This is important. Medicines  Take over-the-counter and prescription medicines only as told by your health care provider. Follow directions carefully. Blood pressure medicines must be taken as prescribed.  Do not skip doses of blood pressure medicine. Doing this puts you at risk for problems and can make the medicine less effective.  Ask your health care provider about side effects or reactions to medicines that you should watch  for. Contact a health care provider if:  You think you are having a reaction to a medicine you are taking.  You have headaches that keep coming back (recurring).  You feel dizzy.  You have swelling in your ankles.  You have trouble with your vision. Get help right away if:  You develop a severe headache or confusion.  You have unusual weakness or numbness.  You feel faint.  You have severe pain in your chest or abdomen.  You vomit repeatedly.  You have trouble breathing. Summary  Hypertension is when the force of blood pumping through your arteries is too strong. If this condition is not controlled, it may put you at risk for serious complications.  Your personal target blood pressure may vary depending on your medical conditions, your age, and other factors. For most people, a normal blood pressure is less than 120/80.  Hypertension is treated with lifestyle changes, medicines, or a combination of both. Lifestyle changes include weight loss, eating a healthy, low-sodium diet, exercising more, and limiting alcohol. This information is not intended to replace advice given to you by your health care provider. Make sure you discuss any questions you have with your health care provider. Document Released: 05/24/2005 Document Revised: 04/21/2016 Document Reviewed: 04/21/2016 Elsevier Interactive Patient Education  [employer].   If you have lab work done today you will be contacted with your lab results within the next 2 weeks.  If you have not heard from [place] then please contact us. The fastest way to get your results is to register for My Chart.   IF you received an x-ray today, you will receive an invoice from [facility]. Please contact [facility] at [phone] with questions or concerns regarding your invoice.   IF you received labwork today, you will receive an invoice from [facility]. Please contact [facility] at [phone] with questions or  concerns regarding your invoice.   Our billing staff will not be able to assist you with questions regarding bills from these companies.  You will be contacted with the lab results as soon as they are available. The fastest way to get your results is to activate your My Chart account. Instructions are located on the last page of this paperwork. If you have not heard from [place] regarding the results in 2 weeks, please contact this office.       Signed,   Meredith Staggers, MD [facility] at [facility].  07/27/18 10:12 AM

## 2018-09-08 ENCOUNTER — Ambulatory Visit: Payer: Managed Care, Other (non HMO) | Admitting: Family Medicine

## 2018-09-08 ENCOUNTER — Telehealth (INDEPENDENT_AMBULATORY_CARE_PROVIDER_SITE_OTHER): Payer: Managed Care, Other (non HMO) | Admitting: Family Medicine

## 2018-09-08 ENCOUNTER — Other Ambulatory Visit: Payer: Self-pay

## 2018-09-08 ENCOUNTER — Encounter: Payer: Self-pay | Admitting: Family Medicine

## 2018-09-08 DIAGNOSIS — I10 Essential (primary) hypertension: Secondary | ICD-10-CM | POA: Diagnosis not present

## 2018-09-08 DIAGNOSIS — J309 Allergic rhinitis, unspecified: Secondary | ICD-10-CM

## 2018-09-08 MED ORDER — LOSARTAN POTASSIUM 50 MG PO TABS: 50.0000 mg | ORAL_TABLET | Freq: Every day | ORAL | 2 refills | Status: DC

## 2018-09-08 MED ORDER — FLUTICASONE PROPIONATE 50 MCG/ACT NA SUSP: 2.0000 | Freq: Every day | NASAL | 3 refills | Status: DC

## 2018-09-08 NOTE — Patient Instructions (Signed)
If blood pressures are remaining in the 90 range on the diastolic readings, go ahead and start losartan 50 mg once per day.  Continue amlodipine same dose.  Watch for lightheadedness, dizziness or low blood pressure readings with the addition of that medicine, and if those occur return to just amlodipine and let me know.  Follow-up with me in 3 months to recheck some blood work as well as blood pressure at that time.  If any new side effects or symptoms prior please let me know.  I refilled the Flonase for allergies, continue that and Zyrtec.

## 2018-09-08 NOTE — Progress Notes (Signed)
Virtual Visit via Video Note  I connected with Willie [name] on 09/08/18 at 10:49 AM by a video enabled telemedicine application and verified that I am speaking with the correct person using two identifiers.   I discussed the limitations, risks, security and privacy concerns of performing an evaluation and management service by telephone and the availability of in person appointments. I also discussed with the patient that there may be a patient responsible charge related to this service. The patient expressed understanding and agreed to proceed, consent obtained  Chief complaint: HTN, all rhinitis   History of Present Illness: Willie [name] is a 58 y.o. male  Hypertension: BP Readings from Last 3 Encounters:  07/27/18 (!) 142/84  06/20/18 120/84  06/15/18 140/80   Lab Results  Component Value Date   CREATININE 1.20 06/15/2018  See last visit on February 20.  Had some swelling earlier in the month after starting HCTZ.  Rash on thighs and swelling around the eyes.  Treated with prednisone after an ED visit for a 12-day taper.  Swelling resolved after prednisone and cessation of HCTZ.  No tongue/lip swelling stridor or wheeze/dyspnea.  Rash also had resolved.  Home blood pressure readings at that time 130-140 over high 80s on just amlodipine.  Decided to continue same dose of amlodipine with continued monitoring.  Still taking norvasc 10mg  qd,  Home readings 120- 40/90 range. 148/97 last week - improved now.   No new side effects of amlodipine.    Allergic Rhinitis: Uses flonase, zyrtec daily. Doing ok - manages symptoms. Watery eyes initially.     Past Medical History:  Diagnosis Date  . Allergy    seasonal  . GERD (gastroesophageal reflux disease)   . Hypertension    dr Merla Riches  . Umbilical hernia    Past Surgical History:  Procedure Laterality Date  . HERNIA REPAIR  01/2002  . UMBILICAL HERNIA REPAIR  09/01/2011   Procedure: HERNIA REPAIR UMBILICAL ADULT;  Surgeon:  Shelly Rubenstein, MD;  Location: [facility] OR;  Service: General;  Laterality: N/A;   Allergies  Allergen Reactions  . Penicillins Hives, Nausea And Vomiting and Swelling   Prior to Admission medications   Medication Sig Start Date End Date Taking? Authorizing Provider  amLODipine (NORVASC) 10 MG tablet Take 1 tablet (10 mg total) by mouth daily. 06/15/18  Yes Shade Flood, MD  cetirizine (ZYRTEC) 10 MG tablet Take 1 tablet (10 mg total) by mouth daily. 04/07/17  Yes English, Judeth Cornfield D, PA  diphenhydrAMINE (BENADRYL) 25 MG tablet Take 25 mg by mouth as needed.   Yes [provider]  fluticasone (FLONASE) 50 MCG/ACT nasal spray Place 2 sprays into both nostrils daily. 05/18/18  Yes McVey, Madelaine Bhat, PA-C  Multiple Vitamins-Minerals (CENTRUM SILVER 50+MEN PO) Take by mouth daily.   Yes [provider]  cyclobenzaprine (FLEXERIL) 10 MG tablet Take 1 tablet (10 mg total) by mouth 3 (three) times daily as needed for muscle spasms. Patient not taking: Reported on 09/08/2018 05/18/18   McVey, Madelaine Bhat, PA-C  famotidine (PEPCID) 20 MG tablet Take 20 mg by mouth as needed for heartburn or indigestion.    [provider]  hydrochlorothiazide (MICROZIDE) 12.5 MG capsule Take 1 capsule (12.5 mg total) by mouth daily. Patient not taking: Reported on 09/08/2018 06/15/18   Shade Flood, MD  Pseudoephedrine-guaiFENesin [facility] D MAX STRENGTH PO) Take by mouth as needed.    [provider]  ranitidine (ZANTAC) 150 MG tablet Take 150  mg by mouth as needed for heartburn.    [provider]   Social History   Socioeconomic History  . Marital status: Married    Spouse name: Not on file  . Number of children: Not on file  . Years of education: Not on file  . Highest education level: Not on file  Occupational History  . Not on file  Social Needs  . Financial resource strain: Not on file  . Food insecurity:    Worry: Not on file    Inability:  Not on file  . Transportation needs:    Medical: Not on file    Non-medical: Not on file  Tobacco Use  . Smoking status: Never Smoker  . Smokeless tobacco: Never Used  Substance and Sexual Activity  . Alcohol use: No  . Drug use: No  . Sexual activity: Yes  Lifestyle  . Physical activity:    Days per week: Not on file    Minutes per session: Not on file  . Stress: Not on file  Relationships  . Social connections:    Talks on phone: Not on file    Gets together: Not on file    Attends religious service: Not on file    Active member of club or organization: Not on file    Attends meetings of clubs or organizations: Not on file    Relationship status: Not on file  . Intimate partner violence:    Fear of current or ex partner: Not on file    Emotionally abused: Not on file    Physically abused: Not on file    Forced sexual activity: Not on file  Other Topics Concern  . Not on file  Social History Narrative  . Not on file    Observations/Objective: No distress, speaking normally, no distress.  Appropriate responses.  Assessment and Plan: Essential hypertension - Plan: losartan (COZAAR) 50 MG tablet  -Borderline control, with some diastolics in the 90s.  Continue amlodipine, but if remains at 90 diastolic or higher, start losartan 50 mg daily.   previous swelling but less likely related to HCTZ, but will avoid for now.  RTC precautions  Allergic rhinitis, unspecified seasonality, unspecified trigger - Plan: fluticasone (FLONASE) 50 MCG/ACT nasal spray  -Improved control with Flonase, antihistamine.  Continue same.  RTC precautions.  Follow Up Instructions:  3 months for recheck blood pressure and labs.  Sooner if new symptoms.  Patient Instructions  If blood pressures are remaining in the 90 range on the diastolic readings, go ahead and start losartan 50 mg once per day.  Continue amlodipine same dose.  Watch for lightheadedness, dizziness or low blood pressure readings  with the addition of that medicine, and if those occur return to just amlodipine and let me know.  Follow-up with me in 3 months to recheck some blood work as well as blood pressure at that time.  If any new side effects or symptoms prior please let me know.  I refilled the Flonase for allergies, continue that and Zyrtec.     I discussed the assessment and treatment plan with the patient. The patient was provided an opportunity to ask questions and all were answered. The patient agreed with the plan and demonstrated an understanding of the instructions.   The patient was advised to call back or seek an in-person evaluation if the symptoms worsen or if the condition fails to improve as anticipated.  I provided 11 minutes of non-face-to-face time during this encounter.  Wendie Agreste, MD

## 2018-09-08 NOTE — Progress Notes (Signed)
Pt c/o of BP check and needs a refill on flonase. No travel.

## 2018-09-27 ENCOUNTER — Other Ambulatory Visit: Payer: Self-pay | Admitting: Family Medicine

## 2018-09-27 DIAGNOSIS — I10 Essential (primary) hypertension: Secondary | ICD-10-CM

## 2018-10-18 ENCOUNTER — Telehealth: Payer: Self-pay | Admitting: Family Medicine

## 2018-10-18 NOTE — Telephone Encounter (Signed)
Copied from CRM [id number]. Topic: Quick Communication - Rx Refill/Question >> Oct 18, 2018 10:30 AM Burchel, Abbi R wrote: Medication: losartan (COZAAR) 50 MG tablet (90 day supply)   Preferred Pharmacy:[facility] [address] ([address] [place] - [address]  [phone] (Phone) [phone] (Fax)     Pt was advised that RX refills may take up to 3 business days. We ask that you follow-up with your pharmacy.

## 2018-10-18 NOTE — Telephone Encounter (Signed)
To soon to refill:  Medication was sent on 09/08/2018 with 2 refill 30 tab.

## 2018-12-13 ENCOUNTER — Other Ambulatory Visit: Payer: Self-pay

## 2018-12-13 ENCOUNTER — Ambulatory Visit (INDEPENDENT_AMBULATORY_CARE_PROVIDER_SITE_OTHER): Payer: Managed Care, Other (non HMO) | Admitting: Family Medicine

## 2018-12-13 ENCOUNTER — Encounter: Payer: Self-pay | Admitting: Family Medicine

## 2018-12-13 VITALS — BP 137/86 | HR 89 | Temp 98.3°F | Ht 70.0 in | Wt 177.0 lb

## 2018-12-13 DIAGNOSIS — I10 Essential (primary) hypertension: Secondary | ICD-10-CM | POA: Diagnosis not present

## 2018-12-13 DIAGNOSIS — R739 Hyperglycemia, unspecified: Secondary | ICD-10-CM

## 2018-12-13 MED ORDER — LOSARTAN POTASSIUM 50 MG PO TABS: 50.0000 mg | ORAL_TABLET | Freq: Every day | ORAL | 2 refills | Status: DC

## 2018-12-13 MED ORDER — AMLODIPINE BESYLATE 10 MG PO TABS: 10.0000 mg | ORAL_TABLET | Freq: Every day | ORAL | 2 refills | Status: DC

## 2018-12-13 NOTE — Patient Instructions (Addendum)
  Blood pressure looks better - no changes in current meds. I will check some labs and let you know if there are any concerns. Let me know if there are questions. Otherwise follow up in 6 months.  Take care.     If you have lab work done today you will be contacted with your lab results within the next 2 weeks.  If you have not heard from [place] then please contact us. The fastest way to get your results is to register for My Chart.   IF you received an x-ray today, you will receive an invoice from [facility]. Please contact [facility] at [phone] with questions or concerns regarding your invoice.   IF you received labwork today, you will receive an invoice from [place]. Please contact [facility] at [phone] with questions or concerns regarding your invoice.   Our billing staff will not be able to assist you with questions regarding bills from these companies.  You will be contacted with the lab results as soon as they are available. The fastest way to get your results is to activate your My Chart account. Instructions are located on the last page of this paperwork. If you have not heard from [place] regarding the results in 2 weeks, please contact this office.

## 2018-12-13 NOTE — Progress Notes (Signed)
Subjective:    Patient ID: Willie [name], male    DOB: 1961-05-08, 58 y.o.   MRN: [id number]  HPI Willie [name] is a 58 y.o. male Presents today for: Chief Complaint  Patient presents with  . Hypertension    follow up with the losartan, says that the medication is seeming to help with the bp. Monitors at home daily, numbers aree 539-76'B systolic   Hypertension: BP Readings from Last 3 Encounters:  12/13/18 137/86  07/27/18 (!) 142/84  06/20/18 120/84   Lab Results  Component Value Date   CREATININE 1.20 06/15/2018  Home readings 341P to 379K systolic.  Tolerating losartan at 50 mg daily, as well as amlodipine 10 mg daily.  Losartan was added after review telemedicine visit April 3 due to some increased readings. No new side effects with current meds.   Hyperglycemia: Lab Results  Component Value Date   HGBA1C 5.6 06/15/2018  Glucose 110 in January.  A1c borderline as above.  Weight is increased 8 pounds since February,  Wt Readings from Last 3 Encounters:  12/13/18 177 lb (80.3 kg)  07/27/18 169 lb 6.4 oz (76.8 kg)  06/20/18 174 lb (78.9 kg)  had been doing better with exercise, but less past few weeks. Off usual regimen. Kids moved back to [place].  No new vision changes, no increased thirst.  No fast food, rare soda - 1-2 per week. Sweet tea - 1 per week. No alcohol.     Patient Active Problem List   Diagnosis Date Noted  . Recurrent umbilical hernia 24/02/7352   Past Medical History:  Diagnosis Date  . Allergy    seasonal  . GERD (gastroesophageal reflux disease)   . Hypertension    dr Laney Pastor  . Umbilical hernia    Past Surgical History:  Procedure Laterality Date  . HERNIA REPAIR  01/2002  . UMBILICAL HERNIA REPAIR  09/01/2011   Procedure: HERNIA REPAIR UMBILICAL ADULT;  Surgeon: Harl Bowie, MD;  Location: [place];  Service: General;  Laterality: N/A;   Allergies  Allergen Reactions  . Penicillins Hives, Nausea And Vomiting and Swelling    Prior to Admission medications   Medication Sig Start Date End Date Taking? Authorizing Provider  amLODipine (NORVASC) 10 MG tablet Take 1 tablet (10 mg total) by mouth daily. 06/15/18  Yes Wendie Agreste, MD  cetirizine (ZYRTEC) 10 MG tablet Take 1 tablet (10 mg total) by mouth daily. 04/07/17  Yes English, Colletta Maryland D, PA  cyclobenzaprine (FLEXERIL) 10 MG tablet Take 1 tablet (10 mg total) by mouth 3 (three) times daily as needed for muscle spasms. 05/18/18  Yes McVey, Gelene Mink, PA-C  diphenhydrAMINE (BENADRYL) 25 MG tablet Take 25 mg by mouth as needed.   Yes [provider]  famotidine (PEPCID) 20 MG tablet Take 20 mg by mouth as needed for heartburn or indigestion.   Yes [provider]  fluticasone (FLONASE) 50 MCG/ACT nasal spray Place 2 sprays into both nostrils daily. 09/08/18  Yes Wendie Agreste, MD  losartan (COZAAR) 50 MG tablet Take 1 tablet (50 mg total) by mouth daily. 09/08/18  Yes Wendie Agreste, MD  Multiple Vitamins-Minerals (CENTRUM SILVER 50+MEN PO) Take by mouth daily.   Yes [provider]  Pseudoephedrine-guaiFENesin (MUCINEX D MAX STRENGTH PO) Take by mouth as needed.   Yes [provider]   Social History   Socioeconomic History  . Marital status: Married    Spouse name: Not on file  .  Number of children: Not on file  . Years of education: Not on file  . Highest education level: Not on file  Occupational History  . Not on file  Social Needs  . Financial resource strain: Not on file  . Food insecurity    Worry: Not on file    Inability: Not on file  . Transportation needs    Medical: Not on file    Non-medical: Not on file  Tobacco Use  . Smoking status: Never Smoker  . Smokeless tobacco: Never Used  Substance and Sexual Activity  . Alcohol use: No  . Drug use: No  . Sexual activity: Yes  Lifestyle  . Physical activity    Days per week: Not on file    Minutes per session: Not on file  . Stress: Not on  file  Relationships  . Social Herbalist on phone: Not on file    Gets together: Not on file    Attends religious service: Not on file    Active member of club or organization: Not on file    Attends meetings of clubs or organizations: Not on file    Relationship status: Not on file  . Intimate partner violence    Fear of current or ex partner: Not on file    Emotionally abused: Not on file    Physically abused: Not on file    Forced sexual activity: Not on file  Other Topics Concern  . Not on file  Social History Narrative  . Not on file    Review of Systems  Constitutional: Negative for fatigue and unexpected weight change.  Eyes: Negative for visual disturbance.  Respiratory: Negative for cough, chest tightness and shortness of breath.   Cardiovascular: Negative for chest pain, palpitations and leg swelling.  Gastrointestinal: Negative for abdominal pain and blood in stool.  Neurological: Negative for dizziness, light-headedness and headaches.       Objective:   Physical Exam Vitals signs reviewed.  Constitutional:      Appearance: He is well-developed.  HENT:     Head: Normocephalic and atraumatic.  Eyes:     Pupils: Pupils are equal, round, and reactive to light.  Neck:     Vascular: No carotid bruit or JVD.  Cardiovascular:     Rate and Rhythm: Normal rate and regular rhythm.     Heart sounds: Normal heart sounds. No murmur.  Pulmonary:     Effort: Pulmonary effort is normal.     Breath sounds: Normal breath sounds. No rales.  Skin:    General: Skin is warm and dry.  Neurological:     Mental Status: He is alert and oriented to person, place, and time.     Vitals:   12/13/18 0950  BP: 137/86  Pulse: 89  Temp: 98.3 F (36.8 C)  TempSrc: Oral  SpO2: 96%  Weight: 177 lb (80.3 kg)  Height: 5' 10"  (1.778 m)        Assessment & Plan:  Willie [name] is a 58 y.o. male Essential hypertension - Plan: Lipid panel, CMP14+EGFR  -  Stable,  tolerating current regimen. Medications refilled. Labs pending as above.    Hyperglycemia - Plan: Hemoglobin A1c  - continue to work on diet, exercise, check A1c.   No orders of the defined types were placed in this encounter.  Patient Instructions    Blood pressure looks better - no changes in current meds. I will check some labs and let you  know if there are any concerns. Let me know if there are questions. Otherwise follow up in 6 months.  Take care.     If you have lab work done today you will be contacted with your lab results within the next 2 weeks.  If you have not heard from [place] then please contact us. The fastest way to get your results is to register for My Chart.   IF you received an x-ray today, you will receive an invoice from [facility]. Please contact [facility] at [phone] with questions or concerns regarding your invoice.   IF you received labwork today, you will receive an invoice from [place]. Please contact [facility] at [phone] with questions or concerns regarding your invoice.   Our billing staff will not be able to assist you with questions regarding bills from these companies.  You will be contacted with the lab results as soon as they are available. The fastest way to get your results is to activate your My Chart account. Instructions are located on the last page of this paperwork. If you have not heard from [place] regarding the results in 2 weeks, please contact this office.      Signed,   Merri Ray, MD [facility].  12/13/18 10:19 AM

## 2018-12-14 LAB — LIPID PANEL
Chol/HDL Ratio: 4.7 ratio (ref 0.0–5.0)
Cholesterol, Total: 185 mg/dL (ref 100–199)
HDL: 39 mg/dL — ABNORMAL LOW (ref >39)
LDL Calculated: 87 mg/dL (ref 0–99)
Triglycerides: 295 mg/dL — ABNORMAL HIGH (ref 0–149)
VLDL Cholesterol Cal: 59 mg/dL — ABNORMAL HIGH (ref 5–40)

## 2018-12-14 LAB — CMP14+EGFR
ALT: 31 IU/L (ref 0–44)
AST: 27 IU/L (ref 0–40)
Albumin/Globulin Ratio: 1.8 (ref 1.2–2.2)
Albumin: 4.4 g/dL (ref 3.8–4.9)
Alkaline Phosphatase: 63 IU/L (ref 39–117)
BUN/Creatinine Ratio: 8 — ABNORMAL LOW (ref 9–20)
BUN: 10 mg/dL (ref 6–24)
Bilirubin Total: 0.4 mg/dL (ref 0.0–1.2)
CO2: 21 mmol/L (ref 20–29)
Calcium: 9.6 mg/dL (ref 8.7–10.2)
Chloride: 101 mmol/L (ref 96–106)
Creatinine, Ser: 1.2 mg/dL (ref 0.76–1.27)
GFR calc Af Amer: 78 mL/min/{1.73_m2} (ref >59)
GFR calc non Af Amer: 67 mL/min/{1.73_m2} (ref >59)
Globulin, Total: 2.5 g/dL (ref 1.5–4.5)
Glucose: 103 mg/dL — ABNORMAL HIGH (ref 65–99)
Potassium: 4.1 mmol/L (ref 3.5–5.2)
Sodium: 138 mmol/L (ref 134–144)
Total Protein: 6.9 g/dL (ref 6.0–8.5)

## 2018-12-14 LAB — HEMOGLOBIN A1C
Est. average glucose Bld gHb Est-mCnc: 120 mg/dL
Hgb A1c MFr Bld: 5.8 % — ABNORMAL HIGH (ref 4.8–5.6)

## 2019-06-18 ENCOUNTER — Encounter: Payer: Self-pay | Admitting: Family Medicine

## 2019-06-18 ENCOUNTER — Ambulatory Visit (INDEPENDENT_AMBULATORY_CARE_PROVIDER_SITE_OTHER): Payer: Managed Care, Other (non HMO) | Admitting: Family Medicine

## 2019-06-18 ENCOUNTER — Other Ambulatory Visit: Payer: Self-pay

## 2019-06-18 VITALS — BP 130/86 | HR 87 | Temp 98.2°F | Ht 70.0 in | Wt 165.8 lb

## 2019-06-18 DIAGNOSIS — E781 Pure hyperglyceridemia: Secondary | ICD-10-CM

## 2019-06-18 DIAGNOSIS — Z0000 Encounter for general adult medical examination without abnormal findings: Secondary | ICD-10-CM | POA: Diagnosis not present

## 2019-06-18 DIAGNOSIS — Z23 Encounter for immunization: Secondary | ICD-10-CM

## 2019-06-18 DIAGNOSIS — I10 Essential (primary) hypertension: Secondary | ICD-10-CM

## 2019-06-18 DIAGNOSIS — J309 Allergic rhinitis, unspecified: Secondary | ICD-10-CM

## 2019-06-18 DIAGNOSIS — R739 Hyperglycemia, unspecified: Secondary | ICD-10-CM

## 2019-06-18 MED ORDER — AMLODIPINE BESYLATE 10 MG PO TABS: 10.0000 mg | ORAL_TABLET | Freq: Every day | ORAL | 2 refills | Status: DC

## 2019-06-18 MED ORDER — SHINGRIX 50 MCG/0.5ML IM SUSR: 0.5000 mL | Freq: Once | INTRAMUSCULAR | 1 refills | Status: AC

## 2019-06-18 MED ORDER — LOSARTAN POTASSIUM 50 MG PO TABS: 50.0000 mg | ORAL_TABLET | Freq: Every day | ORAL | 2 refills | Status: DC

## 2019-06-18 NOTE — Progress Notes (Signed)
Subjective:  Patient ID: Willie [name], male    DOB: [birth year]  Age: 59 y.o. MRN: [id number]  CC:  Chief Complaint  Patient presents with  . Annual Exam    general health pt states he feels good witn no complants.    HPI Willie [name] presents for   Annual exam, med review.   Hypertension: Amlodipine 10 mg, losartan 50 mg that was added last year.  Stable in July. Home readings:117/77-130/82.   No new side effects.  BP Readings from Last 3 Encounters:  06/18/19 130/86  12/13/18 137/86  07/27/18 (!) 142/84   Lab Results  Component Value Date   CREATININE 1.20 12/13/2018   Hyperlipidemia: Hypertriglyceridemia noted last July.  Not currently on statin/meds.  Numbers were improving. The 10-year ASCVD risk score Willie Bussing DC Brooke Bonito., Willie al., Willie [name]) is: 9%   Values used to calculate the score:     Age: 67 years     Sex: Male     Is Non-Hispanic African American: No     Diabetic: No     Tobacco smoker: No     Systolic Blood Pressure: 517 mmHg     Is BP treated: Yes     HDL Cholesterol: 39 mg/dL     Total Cholesterol: 185 mg/dL  Lab Results  Component Value Date   CHOL 185 12/13/2018   HDL 39 (L) 12/13/2018   LDLCALC 87 12/13/2018   TRIG 295 (H) 12/13/2018   CHOLHDL 4.7 12/13/2018   Lab Results  Component Value Date   ALT 31 12/13/2018   AST 27 12/13/2018   ALKPHOS 63 12/13/2018   BILITOT 0.4 12/13/2018   Allergic rhinitis Flonase, Zyrtec. As needed. Not daily. Well controlled.   Rare use pepcid for heartburn.    Cancer screening Colon, colonoscopy 06/20/2018.   Prostate: Declined testing after risks/benefits and potential false positive/negative discussed. Lab Results  Component Value Date   PSA1 2.3 06/15/2018   Immunization History  Administered Date(s) Administered  . Tdap 12/12/2017   Depression screen [facility] 2/9 06/18/2019 12/13/2018 09/08/2018 07/27/2018 06/15/2018  Decreased Interest 0 0 0 0 0  Down, Depressed, Hopeless 0 0 0 0 0  PHQ - 2 Score 0 0 0 0 0    Hearing Screening   125Hz  250Hz  500Hz  1000Hz  2000Hz  3000Hz  4000Hz  6000Hz  8000Hz   Right ear:           Left ear:             Visual Acuity Screening   Right eye Left eye Both eyes  Without correction: 20/30 20/20 20/20   With correction:      Dental: No recent eval.   Exercise: 15-30 min. Few days per week.   History Patient Active Problem List   Diagnosis Date Noted  . Recurrent umbilical hernia 61/60/7371   Past Medical History:  Diagnosis Date  . Allergy    seasonal  . GERD (gastroesophageal reflux disease)   . Hypertension    dr Laney Pastor  . Umbilical hernia    Past Surgical History:  Procedure Laterality Date  . HERNIA REPAIR  01/2002  . UMBILICAL HERNIA REPAIR  3/27/Willie [name]   Procedure: HERNIA REPAIR UMBILICAL ADULT;  Surgeon: Harl Bowie, MD;  Location: [place];  Service: General;  Laterality: N/A;   Allergies  Allergen Reactions  . Penicillins Hives, Nausea And Vomiting and Swelling   Prior to Admission medications   Medication Sig Start Date End Date Taking? Authorizing Provider  amLODipine (NORVASC) 10 MG  tablet Take 1 tablet (10 mg total) by mouth daily. 12/13/18  Yes Wendie Agreste, MD  cetirizine (ZYRTEC) 10 MG tablet Take 1 tablet (10 mg total) by mouth daily. 04/07/17  Yes English, Colletta Maryland D, PA  diphenhydrAMINE (BENADRYL) 25 MG tablet Take 25 mg by mouth as needed.   Yes [provider]  famotidine (PEPCID) 20 MG tablet Take 20 mg by mouth as needed for heartburn or indigestion.   Yes [provider]  losartan (COZAAR) 50 MG tablet Take 1 tablet (50 mg total) by mouth daily. 12/13/18  Yes Wendie Agreste, MD  Multiple Vitamins-Minerals (CENTRUM SILVER 50+MEN PO) Take by mouth daily.   Yes [provider]  Pseudoephedrine-guaiFENesin (MUCINEX D MAX STRENGTH PO) Take by mouth as needed.   Yes [provider]  cyclobenzaprine (FLEXERIL) 10 MG tablet Take 1 tablet (10 mg total) by mouth 3 (three) times daily as needed  for muscle spasms. Patient not taking: Reported on 06/18/2019 05/18/18   McVey, Gelene Mink, PA-C  fluticasone [facility]) 50 MCG/ACT nasal spray Place 2 sprays into both nostrils daily. Patient not taking: Reported on 06/18/2019 09/08/18   Wendie Agreste, MD   Social History   Socioeconomic History  . Marital status: Married    Spouse name: Not on file  . Number of children: Not on file  . Years of education: Not on file  . Highest education level: Not on file  Occupational History  . Not on file  Tobacco Use  . Smoking status: Never Smoker  . Smokeless tobacco: Never Used  Substance and Sexual Activity  . Alcohol use: No  . Drug use: No  . Sexual activity: Yes  Other Topics Concern  . Not on file  Social History Narrative  . Not on file   Social Determinants of Health   Financial Resource Strain:   . Difficulty of Paying Living Expenses: Not on file  Food Insecurity:   . Worried About Charity fundraiser in the Last Year: Not on file  . Ran Out of Food in the Last Year: Not on file  Transportation Needs:   . Lack of Transportation (Medical): Not on file  . Lack of Transportation (Non-Medical): Not on file  Physical Activity:   . Days of Exercise per Week: Not on file  . Minutes of Exercise per Session: Not on file  Stress:   . Feeling of Stress : Not on file  Social Connections:   . Frequency of Communication with Friends and Family: Not on file  . Frequency of Social Gatherings with Friends and Family: Not on file  . Attends Religious Services: Not on file  . Active Member of Clubs or Organizations: Not on file  . Attends Archivist Meetings: Not on file  . Marital Status: Not on file  Intimate Partner Violence:   . Fear of Current or Ex-Partner: Not on file  . Emotionally Abused: Not on file  . Physically Abused: Not on file  . Sexually Abused: Not on file    Review of Systems 13 point review of systems per patient health survey noted.   Negative other than as indicated above or in HPI.    Objective:   Vitals:   06/18/19 0759  BP: 130/86  Pulse: 87  Temp: 98.2 F (36.8 C)  TempSrc: Temporal  SpO2: 97%  Weight: 165 lb 12.8 oz (75.2 kg)  Height: 5' 10"  (1.778 m)    Physical Exam Vitals reviewed.  Constitutional:  Appearance: He is well-developed.  HENT:     Head: Normocephalic and atraumatic.     Right Ear: External ear normal.     Left Ear: External ear normal.  Eyes:     Conjunctiva/sclera: Conjunctivae normal.     Pupils: Pupils are equal, round, and reactive to light.  Neck:     Thyroid: No thyromegaly.  Cardiovascular:     Rate and Rhythm: Normal rate and regular rhythm.     Heart sounds: Normal heart sounds.  Pulmonary:     Effort: Pulmonary effort is normal. No respiratory distress.     Breath sounds: Normal breath sounds. No wheezing.  Abdominal:     General: There is no distension.     Palpations: Abdomen is soft.     Tenderness: There is no abdominal tenderness.  Musculoskeletal:        General: No tenderness. Normal range of motion.     Cervical back: Normal range of motion and neck supple.  Lymphadenopathy:     Cervical: No cervical adenopathy.  Skin:    General: Skin is warm and dry.  Neurological:     Mental Status: He is alert and oriented to person, place, and time.     Deep Tendon Reflexes: Reflexes are normal and symmetric.  Psychiatric:        Behavior: Behavior normal.      Assessment & Plan:  Willie [name] is a 59 y.o. male . Annual physical exam - Plan: Lipid panel, Hemoglobin A1c, CMP14+EGFR  - -anticipatory guidance as below in AVS, screening labs above. Health maintenance items as above in HPI discussed/recommended as applicable.   Need for prophylactic vaccination and inoculation against influenza - Plan: Flu Vaccine QUAD 6+ mos PF IM (Fluarix Quad PF)  Hyperglycemia - Plan: Hemoglobin A1c, CMP14+EGFR  Check A1c.  Exercise discussed.  Essential hypertension  - Plan: CMP14+EGFR  -Stable, no changes  Allergic rhinitis, unspecified seasonality, unspecified trigger  -Stable Flonase, Zyrtec.  Hypertriglyceridemia - Plan: Lipid panel, CMP14+EGFR  -Exercise, check labs.  Borderline ASCVD risk prior.  No meds for now.  Need for shingles vaccine - Plan: Zoster Vaccine Adjuvanted [facility]) injection sent to pharmacy   No orders of the defined types were placed in this encounter.  Patient Instructions       If you have lab work done today you will be contacted with your lab results within the next 2 weeks.  If you have not heard from [place] then please contact us. The fastest way to get your results is to register for My Chart.   IF you received an x-ray today, you will receive an invoice from [facility]. Please contact [facility] at [phone] with questions or concerns regarding your invoice.   IF you received labwork today, you will receive an invoice from [place]. Please contact [facility] at [phone] with questions or concerns regarding your invoice.   Our billing staff will not be able to assist you with questions regarding bills from these companies.  You will be contacted with the lab results as soon as they are available. The fastest way to get your results is to activate your My Chart account. Instructions are located on the last page of this paperwork. If you have not heard from [place] regarding the results in 2 weeks, please contact this office.         Signed, Merri Ray, MD [facility]

## 2019-06-18 NOTE — Patient Instructions (Addendum)
At this time no medication changes.  Exercise most days per week with 150 minutes total per week.  Schedule dentist visit if possible.  Shingles vaccine sent to pharmacy, flu shot given today.  Follow-up in 6 months to review medications.  Thank you for coming in today and take care  Keeping you healthy  Get these tests  Blood pressure- Have your blood pressure checked once a year by your healthcare provider.  Normal blood pressure is 120/80  Weight- Have your body mass index (BMI) calculated to screen for obesity.  BMI is a measure of body fat based on height and weight. You can also calculate your own BMI at [email or web address].  Cholesterol- Have your cholesterol checked every year.  Diabetes- Have your blood sugar checked regularly if you have high blood pressure, high cholesterol, have a family history of diabetes or if you are overweight.  Screening for Colon Cancer- Colonoscopy starting at age 66.  Screening may begin sooner depending on your family history and other health conditions. Follow up colonoscopy as directed by your Gastroenterologist.  Screening for Prostate Cancer- Both blood work (PSA) and a rectal exam help screen for Prostate Cancer.  Screening begins at age 73 with African-American men and at age 38 with Caucasian men.  Screening may begin sooner depending on your family history.  Take these medicines  Aspirin- One aspirin daily can help prevent Heart disease and Stroke.  Flu shot- Every fall.  Tetanus- Every 10 years.  Zostavax- Once after the age of 38 to prevent Shingles.  Pneumonia shot- Once after the age of 23; if you are younger than 74, ask your healthcare provider if you need a Pneumonia shot.  Take these steps  Don't smoke- If you do smoke, talk to your doctor about quitting.  For tips on how to quit, go to www.smokefree.gov or call 1-800-QUIT-NOW.  Be physically active- Exercise 5 days a week for at least 30 minutes.  If you are not  already physically active start slow and gradually work up to 30 minutes of moderate physical activity.  Examples of moderate activity include walking briskly, mowing the yard, dancing, swimming, bicycling, etc.  Eat a healthy diet- Eat a variety of healthy food such as fruits, vegetables, low fat milk, low fat cheese, yogurt, lean meant, poultry, fish, beans, tofu, etc. For more information go to www.thenutritionsource.org  Drink alcohol in moderation- Limit alcohol intake to less than two drinks a day. Never drink and drive.  Dentist- Brush and floss twice daily; visit your dentist twice a year.  Depression- Your emotional health is as important as your physical health. If you're feeling down, or losing interest in things you would normally enjoy please talk to your healthcare provider.  Eye exam- Visit your eye doctor every year.  Safe sex- If you may be exposed to a sexually transmitted infection, use a condom.  Seat belts- Seat belts can save your life; always wear one.  Smoke/Carbon Monoxide detectors- These detectors need to be installed on the appropriate level of your home.  Replace batteries at least once a year.  Skin cancer- When out in the sun, cover up and use sunscreen 15 SPF or higher.  Violence- If anyone is threatening you, please tell your healthcare provider.  Living Will/ Health care power of attorney- Speak with your healthcare provider and family.  If you have lab work done today you will be contacted with your lab results within the next 2 weeks.  If you have  not heard from [place] then please contact us. The fastest way to get your results is to register for My Chart.   IF you received an x-ray today, you will receive an invoice from [facility]. Please contact [facility] at [phone] with questions or concerns regarding your invoice.   IF you received labwork today, you will receive an invoice from [place]. Please contact [facility] at  [phone] with questions or concerns regarding your invoice.   Our billing staff will not be able to assist you with questions regarding bills from these companies.  You will be contacted with the lab results as soon as they are available. The fastest way to get your results is to activate your My Chart account. Instructions are located on the last page of this paperwork. If you have not heard from [place] regarding the results in 2 weeks, please contact this office.

## 2019-06-19 ENCOUNTER — Encounter: Payer: Managed Care, Other (non HMO) | Admitting: Family Medicine

## 2019-06-19 LAB — CMP14+EGFR
ALT: 24 IU/L (ref 0–44)
AST: 23 IU/L (ref 0–40)
Albumin/Globulin Ratio: 1.8 (ref 1.2–2.2)
Albumin: 4.5 g/dL (ref 3.8–4.9)
Alkaline Phosphatase: 68 IU/L (ref 39–117)
BUN/Creatinine Ratio: 12 (ref 9–20)
BUN: 14 mg/dL (ref 6–24)
Bilirubin Total: 0.4 mg/dL (ref 0.0–1.2)
CO2: 25 mmol/L (ref 20–29)
Calcium: 9.3 mg/dL (ref 8.7–10.2)
Chloride: 103 mmol/L (ref 96–106)
Creatinine, Ser: 1.16 mg/dL (ref 0.76–1.27)
GFR calc Af Amer: 80 mL/min/{1.73_m2} (ref >59)
GFR calc non Af Amer: 70 mL/min/{1.73_m2} (ref >59)
Globulin, Total: 2.5 g/dL (ref 1.5–4.5)
Glucose: 104 mg/dL — ABNORMAL HIGH (ref 65–99)
Potassium: 4.5 mmol/L (ref 3.5–5.2)
Sodium: 142 mmol/L (ref 134–144)
Total Protein: 7 g/dL (ref 6.0–8.5)

## 2019-06-19 LAB — LIPID PANEL
Chol/HDL Ratio: 4.4 ratio (ref 0.0–5.0)
Cholesterol, Total: 198 mg/dL (ref 100–199)
HDL: 45 mg/dL (ref >39)
LDL Chol Calc (NIH): 106 mg/dL — ABNORMAL HIGH (ref 0–99)
Triglycerides: 277 mg/dL — ABNORMAL HIGH (ref 0–149)
VLDL Cholesterol Cal: 47 mg/dL — ABNORMAL HIGH (ref 5–40)

## 2019-06-19 LAB — HEMOGLOBIN A1C
Est. average glucose Bld gHb Est-mCnc: 117 mg/dL
Hgb A1c MFr Bld: 5.7 % — ABNORMAL HIGH (ref 4.8–5.6)

## 2019-07-13 ENCOUNTER — Ambulatory Visit (INDEPENDENT_AMBULATORY_CARE_PROVIDER_SITE_OTHER): Payer: Managed Care, Other (non HMO) | Admitting: Registered Nurse

## 2019-07-13 ENCOUNTER — Other Ambulatory Visit: Payer: Self-pay

## 2019-07-13 ENCOUNTER — Encounter: Payer: Self-pay | Admitting: Registered Nurse

## 2019-07-13 ENCOUNTER — Ambulatory Visit: Payer: Self-pay | Admitting: *Deleted

## 2019-07-13 VITALS — BP 124/85 | HR 82 | Temp 97.2°F | Resp 16 | Ht 70.0 in | Wt 169.6 lb

## 2019-07-13 DIAGNOSIS — L02219 Cutaneous abscess of trunk, unspecified: Secondary | ICD-10-CM

## 2019-07-13 MED ORDER — DOXYCYCLINE HYCLATE 100 MG PO TABS: 100.0000 mg | ORAL_TABLET | Freq: Two times a day (BID) | ORAL | 0 refills | Status: DC

## 2019-07-13 NOTE — Patient Instructions (Signed)
° ° ° °  If you have lab work done today you will be contacted with your lab results within the next 2 weeks.  If you have not heard from us then please contact us. The fastest way to get your results is to register for My Chart. ° ° °IF you received an x-ray today, you will receive an invoice from [place] [facility]. Please contact [facility] at [phone] with questions or concerns regarding your invoice.  ° °IF you received labwork today, you will receive an invoice from [facility]. Please contact [facility] at [phone] with questions or concerns regarding your invoice.  ° °Our billing staff will not be able to assist you with questions regarding bills from these companies. ° °You will be contacted with the lab results as soon as they are available. The fastest way to get your results is to activate your My Chart account. Instructions are located on the last page of this paperwork. If you have not heard from us regarding the results in 2 weeks, please contact this office. °  ° ° ° °

## 2019-07-13 NOTE — Telephone Encounter (Signed)
  I returned his call.   He is concerned that his left arm where he got a flu shot on 06/18/2019 is still sore, especially with movement, tender to the touch and has a small knot at the injection site.    "I feel like the shot was given very high up on my arm almost at the shoulder".    "I don't know if that has anything to do with it but thought I would mention it."  I warm transferred his call to Corina at Dr. Paralee Cancel office to be scheduled.   Dr. Neva Seat has appts available today, she said.  I went over the care advice with him.   He verbalized understanding and thanked me for my help.   Reason for Disposition . [1] Pain, tenderness, or swelling at the injection site AND [2] persists > 3 days  Answer Assessment - Initial Assessment Questions 1. SYMPTOMS: "What is the main symptom?" (e.g., redness, swelling, pain)      I got a flu shot 06/18/2019.     It's not red.   I have pain when I get my arm to 90 degrees or to reach for something hurts.    It started a day after I got the shot.   Every day it keeps getting worse. 2. ONSET: "When was the vaccine (shot) given?" "How much later did the 1 day after the shot. begin?" (e.g., hours, days ago)      06/18/2019 3. SEVERITY: "How bad is it?"      I feel like the shot was given too high on my arm.   It's very tender to the touch.   Shot was given in left arm    There is a slight knot there.   No drainage. 4. FEVER: "Is there a fever?" If so, ask: "What is it, how was it measured, and when did it start?"      No fever 5. IMMUNIZATIONS GIVEN: "What shots have you recently received?"     Flu shot only 6. PAST REACTIONS: "Have you reacted to immunizations before?" If so, ask: "What happened?"     No 7. OTHER SYMPTOMS: "Do you have any other symptoms?"     No   Just pain and tenderness as listed above.  Protocols used: IMMUNIZATION REACTIONS-A-AH

## 2019-07-18 ENCOUNTER — Encounter: Payer: Self-pay | Admitting: Registered Nurse

## 2019-07-18 NOTE — Progress Notes (Signed)
Acute Office Visit  Subjective:    Patient ID: Willie [name], male    DOB: [birth year], 59 y.o.   MRN: [id number]  Chief Complaint  Patient presents with  . Arm Pain    patient stated he came for a flu shot last month in the left arm amd it is extremly painful to even move the arm or even lift it.    HPI Patient is in today for concern for flu shot reaction  Had the shot in our clinic close to 1 mo ago. Arm is still painful. Left upper arm on deltoid. Notes that he feels he has limited ROM, and it feels like a "knot" is there. The pain has not worsened or improved. Has not tried treatment. No systemic symptoms. No rash, drainage, redness, or other local signs of infection  Past Medical History:  Diagnosis Date  . Allergy    seasonal  . GERD (gastroesophageal reflux disease)   . Hypertension    dr Laney Pastor  . Umbilical hernia     Past Surgical History:  Procedure Laterality Date  . HERNIA REPAIR  01/2002  . UMBILICAL HERNIA REPAIR  09/01/2011   Procedure: HERNIA REPAIR UMBILICAL ADULT;  Surgeon: Harl Bowie, MD;  Location: [facility];  Service: General;  Laterality: N/A;    Family History  Problem Relation Age of Onset  . Cancer Father        lung  . Pneumonia Father   . Lung cancer Father   . Hypertension Mother   . Heart disease Mother   . Seizures Mother   . Healthy Sister   . Healthy Brother   . Cancer Maternal Grandmother   . Heart disease Paternal Grandfather   . Healthy Brother     Social History   Socioeconomic History  . Marital status: Married    Spouse name: Not on file  . Number of children: Not on file  . Years of education: Not on file  . Highest education level: Not on file  Occupational History  . Not on file  Tobacco Use  . Smoking status: Never Smoker  . Smokeless tobacco: Never Used  Substance and Sexual Activity  . Alcohol use: No  . Drug use: No  . Sexual activity: Yes  Other Topics Concern  . Not on file  Social History  Narrative  . Not on file   Social Determinants of Health   Financial Resource Strain:   . Difficulty of Paying Living Expenses: Not on file  Food Insecurity:   . Worried About Charity fundraiser in the Last Year: Not on file  . Ran Out of Food in the Last Year: Not on file  Transportation Needs:   . Lack of Transportation (Medical): Not on file  . Lack of Transportation (Non-Medical): Not on file  Physical Activity:   . Days of Exercise per Week: Not on file  . Minutes of Exercise per Session: Not on file  Stress:   . Feeling of Stress : Not on file  Social Connections:   . Frequency of Communication with Friends and Family: Not on file  . Frequency of Social Gatherings with Friends and Family: Not on file  . Attends Religious Services: Not on file  . Active Member of Clubs or Organizations: Not on file  . Attends Archivist Meetings: Not on file  . Marital Status: Not on file  Intimate Partner Violence:   . Fear of Current or Ex-Partner: Not on  file  . Emotionally Abused: Not on file  . Physically Abused: Not on file  . Sexually Abused: Not on file    Outpatient Medications Prior to Visit  Medication Sig Dispense Refill  . amLODipine (NORVASC) 10 MG tablet Take 1 tablet (10 mg total) by mouth daily. 90 tablet 2  . cetirizine (ZYRTEC) 10 MG tablet Take 1 tablet (10 mg total) by mouth daily. 30 tablet 11  . diphenhydrAMINE (BENADRYL) 25 MG tablet Take 25 mg by mouth as needed.    . famotidine (PEPCID) 20 MG tablet Take 20 mg by mouth as needed for heartburn or indigestion.    Marland Kitchen losartan (COZAAR) 50 MG tablet Take 1 tablet (50 mg total) by mouth daily. 90 tablet 2  . Multiple Vitamins-Minerals (CENTRUM SILVER 50+MEN PO) Take by mouth daily.    . Pseudoephedrine-guaiFENesin (MUCINEX D MAX STRENGTH PO) Take by mouth as needed.    . fluticasone (FLONASE) 50 MCG/ACT nasal spray Place 2 sprays into both nostrils daily. (Patient not taking: Reported on 06/18/2019) 48 g 3    No facility-administered medications prior to visit.    Allergies  Allergen Reactions  . Penicillins Hives, Nausea And Vomiting and Swelling    Review of Systems  Constitutional: Negative.   HENT: Negative.   Eyes: Negative.   Respiratory: Negative.   Cardiovascular: Negative.   Gastrointestinal: Negative.   Endocrine: Negative.   Genitourinary: Negative.   Musculoskeletal: Positive for myalgias (L deltoid). Negative for arthralgias, back pain, gait problem, joint swelling, neck pain and neck stiffness.  Skin: Negative.   Allergic/Immunologic: Negative.   Neurological: Negative.   Hematological: Negative.   Psychiatric/Behavioral: Negative.   All other systems reviewed and are negative.      Objective:    Physical Exam Vitals and nursing note reviewed.  Constitutional:      General: He is not in acute distress.    Appearance: Normal appearance. He is normal weight. He is not ill-appearing, toxic-appearing or diaphoretic.  HENT:     Head: Normocephalic and atraumatic.  Cardiovascular:     Rate and Rhythm: Normal rate and regular rhythm.  Pulmonary:     Effort: Pulmonary effort is normal. No respiratory distress.  Musculoskeletal:        General: Tenderness (discrete bump in L deltoid, firm, well circumscribed, ttp, 2-3cm in diameter, deep, not visible but palpable.) present. No swelling or deformity.  Skin:    General: Skin is warm and dry.     Capillary Refill: Capillary refill takes less than 2 seconds.     Coloration: Skin is not jaundiced or pale.     Findings: No bruising, erythema, lesion or rash.  Neurological:     General: No focal deficit present.     Mental Status: He is alert and oriented to person, place, and time.  Psychiatric:        Mood and Affect: Mood normal.        Behavior: Behavior normal.        Thought Content: Thought content normal.        Judgment: Judgment normal.     BP 124/85   Pulse 82   Temp (!) 97.2 F (36.2 C) (Temporal)    Resp 16   Ht 5\' 10"  (1.778 m)   Wt 169 lb 9.6 oz (76.9 kg)   SpO2 97%   BMI 24.34 kg/m  Wt Readings from Last 3 Encounters:  07/13/19 169 lb 9.6 oz (76.9 kg)  06/18/19 165 lb 12.8 oz (  75.2 kg)  12/13/18 177 lb (80.3 kg)    There are no preventive care reminders to display for this patient.  There are no preventive care reminders to display for this patient.   Lab Results  Component Value Date   TSH 1.340 06/15/2018   Lab Results  Component Value Date   WBC 7.1 05/09/2015   HGB 16.3 05/09/2015   HCT 47.1 05/09/2015   MCV 90.2 05/09/2015   PLT 269 08/30/2011   Lab Results  Component Value Date   NA 142 06/18/2019   K 4.5 06/18/2019   CO2 25 06/18/2019   GLUCOSE 104 (H) 06/18/2019   BUN 14 06/18/2019   CREATININE 1.16 06/18/2019   BILITOT 0.4 06/18/2019   ALKPHOS 68 06/18/2019   AST 23 06/18/2019   ALT 24 06/18/2019   PROT 7.0 06/18/2019   ALBUMIN 4.5 06/18/2019   CALCIUM 9.3 06/18/2019   Lab Results  Component Value Date   CHOL 198 06/18/2019   Lab Results  Component Value Date   HDL 45 06/18/2019   Lab Results  Component Value Date   LDLCALC 106 (H) 06/18/2019   Lab Results  Component Value Date   TRIG 277 (H) 06/18/2019   Lab Results  Component Value Date   CHOLHDL 4.4 06/18/2019   Lab Results  Component Value Date   HGBA1C 5.7 (H) 06/18/2019       Assessment & Plan:   Problem List Items Addressed This Visit    None    Visit Diagnoses    Trunk abscess    -  Primary   Relevant Medications   doxycycline (VIBRA-TABS) 100 MG tablet       Meds ordered this encounter  Medications  . doxycycline (VIBRA-TABS) 100 MG tablet    Sig: Take 1 tablet (100 mg total) by mouth 2 (two) times daily.    Dispense:  20 tablet    Refill:  0    Order Specific Question:   Supervising Provider    Answer:   DAQUANN, MERRIOTT [id number]   PLAN  Suspect a small cyst may have formed around the injection site - will try short course of doxycycline but  may have to consider imaging if this doesn't resolve  Suggest otc analgesics for pain and discomfort  Patient encouraged to call clinic with any questions, comments, or concerns.   Janeece Agee, NP

## 2019-10-15 ENCOUNTER — Other Ambulatory Visit: Payer: Self-pay | Admitting: Family Medicine

## 2019-10-15 DIAGNOSIS — I10 Essential (primary) hypertension: Secondary | ICD-10-CM

## 2019-12-17 ENCOUNTER — Encounter: Payer: Self-pay | Admitting: Family Medicine

## 2019-12-17 ENCOUNTER — Ambulatory Visit (INDEPENDENT_AMBULATORY_CARE_PROVIDER_SITE_OTHER): Payer: Managed Care, Other (non HMO) | Admitting: Family Medicine

## 2019-12-17 ENCOUNTER — Other Ambulatory Visit: Payer: Self-pay

## 2019-12-17 VITALS — BP 125/83 | HR 78 | Temp 98.3°F | Ht 70.0 in | Wt 163.0 lb

## 2019-12-17 DIAGNOSIS — E781 Pure hyperglyceridemia: Secondary | ICD-10-CM

## 2019-12-17 DIAGNOSIS — I10 Essential (primary) hypertension: Secondary | ICD-10-CM

## 2019-12-17 DIAGNOSIS — R739 Hyperglycemia, unspecified: Secondary | ICD-10-CM | POA: Diagnosis not present

## 2019-12-17 DIAGNOSIS — R7303 Prediabetes: Secondary | ICD-10-CM

## 2019-12-17 MED ORDER — AMLODIPINE BESYLATE 10 MG PO TABS: 10.0000 mg | ORAL_TABLET | Freq: Every day | ORAL | 2 refills | Status: DC

## 2019-12-17 MED ORDER — LOSARTAN POTASSIUM 50 MG PO TABS: 50.0000 mg | ORAL_TABLET | Freq: Every day | ORAL | 2 refills | Status: DC

## 2019-12-17 NOTE — Patient Instructions (Signed)
° ° ° °  If you have lab work done today you will be contacted with your lab results within the next 2 weeks.  If you have not heard from us then please contact us. The fastest way to get your results is to register for My Chart. ° ° °IF you received an x-ray today, you will receive an invoice from [facility]. Please contact [facility] at [phone] with questions or concerns regarding your invoice.  ° °IF you received labwork today, you will receive an invoice from [facility]. Please contact [facility] at [phone] with questions or concerns regarding your invoice.  ° °Our billing staff will not be able to assist you with questions regarding bills from these companies. ° °You will be contacted with the lab results as soon as they are available. The fastest way to get your results is to activate your My Chart account. Instructions are located on the last page of this paperwork. If you have not heard from us regarding the results in 2 weeks, please contact this office. °  ° ° ° °

## 2019-12-17 NOTE — Progress Notes (Signed)
Subjective:  Patient ID: Willie [name], male    DOB: April 25, 1961  Age: 59 y.o. MRN: [id number]  CC:  Chief Complaint  Patient presents with  . Follow-up    on hypertension and hyperglicimia. Pt reports no issues with BP. Pt checks his BP 1-2x daily and pt reports the readings have been around todays reading of 125/83. pt reports no physical symptoms of these conditions. pt reports medications are working fine with no side effects. Pt dosn't check BS at home.    HPI Willie [name] presents for   Hypertension: Last discussed at his physical in January, continued amlodipine 10 mg, losartan 50 mg at that time. Home readings: 110-120 range/70's-80's.  No new side effects. Feels well.  BP Readings from Last 3 Encounters:  12/17/19 125/83  07/13/19 124/85  06/18/19 130/86   Lab Results  Component Value Date   CREATININE 1.16 06/18/2019   Hyperlipidemia: Mixed, predominantly hypertriglyceridemia No current meds.  Borderline ASCVD risk previously. The 10-year ASCVD risk score Denman Serafino DC Montez Hageman., et al., 2013) is: 8.8%   Values used to calculate the score:     Age: 67 years     Sex: Male     Is Non-Hispanic African American: No     Diabetic: No     Tobacco smoker: No     Systolic Blood Pressure: 125 mmHg     Is BP treated: Yes     HDL Cholesterol: 45 mg/dL     Total Cholesterol: 198 mg/dL   Lab Results  Component Value Date   CHOL 198 06/18/2019   HDL 45 06/18/2019   LDLCALC 106 (H) 06/18/2019   TRIG 277 (H) 06/18/2019   CHOLHDL 4.4 06/18/2019   Lab Results  Component Value Date   ALT 24 06/18/2019   AST 23 06/18/2019   ALKPHOS 68 06/18/2019   BILITOT 0.4 06/18/2019    Prediabetes Borderline with A1c of 5.7 in January, glucose 104 at that time.  Has lost weight since January visit. Trying to eat better and watching sweets/snacks.  Sugar-containing beverages: has cut back soda, only once on occasion. Cut sugar and creamer some on coffee.  Fast food: rare.  Exercise:  walking BID with wife. Some running at times.  Wt Readings from Last 3 Encounters:  12/17/19 163 lb (73.9 kg)  07/13/19 169 lb 9.6 oz (76.9 kg)  06/18/19 165 lb 12.8 oz (75.2 kg)      History Patient Active Problem List   Diagnosis Date Noted  . 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 Allergy    seasonal  . GERD (gastroesophageal reflux disease)   . Hypertension    dr Merla Riches  . Umbilical hernia    Past Surgical History:  Procedure Laterality Date  . HERNIA REPAIR  01/2002  . UMBILICAL HERNIA REPAIR  09/01/2011   Procedure: HERNIA REPAIR UMBILICAL ADULT;  Surgeon: Shelly Rubenstein, MD;  Location: [facility];  Service: General;  Laterality: N/A;   Allergies  Allergen Reactions  . Penicillins Hives, Nausea And Vomiting and Swelling   Prior to Admission medications   Medication Sig Start Date End Date Taking? Authorizing Provider  amLODipine (NORVASC) 10 MG tablet Take 1 tablet (10 mg total) by mouth daily. 06/18/19  Yes Shade Flood, MD  cetirizine (ZYRTEC) 10 MG tablet Take 1 tablet (10 mg total) by mouth daily. 04/07/17  Yes Trena Platt D, PA  diphenhydrAMINE (BENADRYL) 25 MG tablet Take 25 mg  by mouth as needed.   Yes [provider]  famotidine (PEPCID) 20 MG tablet Take 20 mg by mouth as needed for heartburn or indigestion.   Yes [provider]  losartan (COZAAR) 50 MG tablet Take 1 tablet by mouth once daily 10/15/19  Yes Shade Flood, MD  Multiple Vitamins-Minerals (CENTRUM SILVER 50+MEN PO) Take by mouth daily.   Yes [provider]  doxycycline (VIBRA-TABS) 100 MG tablet Take 1 tablet (100 mg total) by mouth 2 (two) times daily. Patient not taking: Reported on 12/17/2019 07/13/19   Janeece Agee, NP  fluticasone [facility]) 50 MCG/ACT nasal spray Place 2 sprays into both nostrils daily. Patient not taking: Reported on 06/18/2019 09/08/18   Shade Flood, MD  Pseudoephedrine-guaiFENesin  [facility] D MAX STRENGTH PO) Take by mouth as needed. Patient not taking: Reported on 12/17/2019    [provider]   Social History   Socioeconomic History  . Marital status: Married    Spouse name: Not on file  . Number of children: Not on file  . Years of education: Not on file  . Highest education level: Not on file  Occupational History  . Not on file  Tobacco Use  . Smoking status: Never Smoker  . Smokeless tobacco: Never Used  Substance and Sexual Activity  . Alcohol use: No  . Drug use: No  . Sexual activity: Yes  Other Topics Concern  . Not on file  Social History Narrative  . Not on file   Social Determinants of Health   Financial Resource Strain:   . Difficulty of Paying Living Expenses:   Food Insecurity:   . Worried About Programme researcher, broadcasting/film/video in the Last Year:   . Barista in the Last Year:   Transportation Needs:   . Freight forwarder (Medical):   Marland Kitchen Lack of Transportation (Non-Medical):   Physical Activity:   . Days of Exercise per Week:   . Minutes of Exercise per Session:   Stress:   . Feeling of Stress :   Social Connections:   . Frequency of Communication with Friends and Family:   . Frequency of Social Gatherings with Friends and Family:   . Attends Religious Services:   . Active Member of Clubs or Organizations:   . Attends Banker Meetings:   Marland Kitchen Marital Status:   Intimate Partner Violence:   . Fear of Current or Ex-Partner:   . Emotionally Abused:   Marland Kitchen Physically Abused:   . Sexually Abused:     Review of Systems  Constitutional: Negative for fatigue and unexpected weight change.  Eyes: Negative for visual disturbance.  Respiratory: Negative for cough, chest tightness and shortness of breath.   Cardiovascular: Negative for chest pain, palpitations and leg swelling.  Gastrointestinal: Negative for abdominal pain and blood in stool.  Neurological: Negative for dizziness, light-headedness and headaches.      Objective:   Vitals:   12/17/19 0939  BP: 125/83  Pulse: 78  Temp: 98.3 F (36.8 C)  TempSrc: Temporal  SpO2: 96%  Weight: 163 lb (73.9 kg)  Height: 5\' 10"  (1.778 m)     Physical Exam Vitals reviewed.  Constitutional:      Appearance: He is well-developed.  HENT:     Head: Normocephalic and atraumatic.  Eyes:     Pupils: Pupils are equal, round, and reactive to light.  Neck:     Vascular: No carotid bruit or JVD.  Cardiovascular:  Rate and Rhythm: Normal rate and regular rhythm.     Heart sounds: Normal heart sounds. No murmur heard.   Pulmonary:     Effort: Pulmonary effort is normal.     Breath sounds: Normal breath sounds. No rales.  Skin:    General: Skin is warm and dry.  Neurological:     Mental Status: He is alert and oriented to person, place, and time.        Assessment & Plan:  Willie [name] is a 59 y.o. male . Hyperglycemia - Plan: Hemoglobin A1c Prediabetes  -Commended on diet changes, exercise.  Repeat A1c  Essential hypertension - Plan: Comprehensive metabolic panel, Lipid panel, amLODipine (NORVASC) 10 MG tablet, losartan (COZAAR) 50 MG tablet  - Stable, tolerating current regimen. Medications refilled. Labs pending as above.   Hypertriglyceridemia  -Hyperlipidemia with predominant hypertriglyceridemia, may be related to hyperglycemia as above, but anticipate improvements with diet/exercise changes, repeat labs.  Borderline ASCVD risk score previously.  Option of coronary calcium scoring if question regarding statin need.  76-month follow-up for physical  Meds ordered this encounter  Medications  . amLODipine (NORVASC) 10 MG tablet    Sig: Take 1 tablet (10 mg total) by mouth daily.    Dispense:  90 tablet    Refill:  2  . losartan (COZAAR) 50 MG tablet    Sig: Take 1 tablet (50 mg total) by mouth daily.    Dispense:  90 tablet    Refill:  2   Patient Instructions       If you have lab work done today you will be  contacted with your lab results within the next 2 weeks.  If you have not heard from [place] then please contact us. The fastest way to get your results is to register for My Chart.   IF you received an x-ray today, you will receive an invoice from [facility]. Please contact [facility] at [phone] with questions or concerns regarding your invoice.   IF you received labwork today, you will receive an invoice from [facility]. Please contact [facility] at [phone] with questions or concerns regarding your invoice.   Our billing staff will not be able to assist you with questions regarding bills from these companies.  You will be contacted with the lab results as soon as they are available. The fastest way to get your results is to activate your My Chart account. Instructions are located on the last page of this paperwork. If you have not heard from [place] regarding the results in 2 weeks, please contact this office.         Signed, Meredith Staggers, MD [facility]

## 2019-12-18 LAB — COMPREHENSIVE METABOLIC PANEL
ALT: 23 IU/L (ref 0–44)
AST: 19 IU/L (ref 0–40)
Albumin/Globulin Ratio: 1.9 (ref 1.2–2.2)
Albumin: 4.6 g/dL (ref 3.8–4.9)
Alkaline Phosphatase: 65 IU/L (ref 48–121)
BUN/Creatinine Ratio: 16 (ref 9–20)
BUN: 18 mg/dL (ref 6–24)
Bilirubin Total: 0.5 mg/dL (ref 0.0–1.2)
CO2: 22 mmol/L (ref 20–29)
Calcium: 9.2 mg/dL (ref 8.7–10.2)
Chloride: 102 mmol/L (ref 96–106)
Creatinine, Ser: 1.11 mg/dL (ref 0.76–1.27)
GFR calc Af Amer: 84 mL/min/{1.73_m2} (ref >59)
GFR calc non Af Amer: 73 mL/min/{1.73_m2} (ref >59)
Globulin, Total: 2.4 g/dL (ref 1.5–4.5)
Glucose: 103 mg/dL — ABNORMAL HIGH (ref 65–99)
Potassium: 4.7 mmol/L (ref 3.5–5.2)
Sodium: 146 mmol/L — ABNORMAL HIGH (ref 134–144)
Total Protein: 7 g/dL (ref 6.0–8.5)

## 2019-12-18 LAB — LIPID PANEL
Chol/HDL Ratio: 4.7 ratio (ref 0.0–5.0)
Cholesterol, Total: 197 mg/dL (ref 100–199)
HDL: 42 mg/dL (ref >39)
LDL Chol Calc (NIH): 116 mg/dL — ABNORMAL HIGH (ref 0–99)
Triglycerides: 226 mg/dL — ABNORMAL HIGH (ref 0–149)
VLDL Cholesterol Cal: 39 mg/dL (ref 5–40)

## 2019-12-18 LAB — HEMOGLOBIN A1C
Est. average glucose Bld gHb Est-mCnc: 117 mg/dL
Hgb A1c MFr Bld: 5.7 % — ABNORMAL HIGH (ref 4.8–5.6)

## 2019-12-25 ENCOUNTER — Encounter: Payer: Self-pay | Admitting: Radiology

## 2020-01-30 ENCOUNTER — Other Ambulatory Visit: Payer: Self-pay

## 2020-01-30 ENCOUNTER — Ambulatory Visit (INDEPENDENT_AMBULATORY_CARE_PROVIDER_SITE_OTHER): Payer: Managed Care, Other (non HMO) | Admitting: Registered Nurse

## 2020-01-30 ENCOUNTER — Encounter: Payer: Self-pay | Admitting: Registered Nurse

## 2020-01-30 VITALS — BP 134/81 | HR 89 | Temp 98.1°F | Resp 18 | Ht 70.0 in | Wt 164.0 lb

## 2020-01-30 DIAGNOSIS — S46812A Strain of other muscles, fascia and tendons at shoulder and upper arm level, left arm, initial encounter: Secondary | ICD-10-CM

## 2020-01-30 MED ORDER — PREDNISONE 10 MG PO TABS: 30.0000 mg | ORAL_TABLET | Freq: Every day | ORAL | 0 refills | Status: DC

## 2020-01-30 MED ORDER — METHOCARBAMOL 500 MG PO TABS: 500.0000 mg | ORAL_TABLET | Freq: Four times a day (QID) | ORAL | 0 refills | Status: DC

## 2020-01-30 MED ORDER — MELOXICAM 7.5 MG PO TABS: 7.5000 mg | ORAL_TABLET | Freq: Every day | ORAL | 0 refills | Status: DC

## 2020-01-30 NOTE — Patient Instructions (Signed)
° ° ° °  If you have lab work done today you will be contacted with your lab results within the next 2 weeks.  If you have not heard from us then please contact us. The fastest way to get your results is to register for My Chart. ° ° °IF you received an x-ray today, you will receive an invoice from [facility]. Please contact [facility] at [phone] with questions or concerns regarding your invoice.  ° °IF you received labwork today, you will receive an invoice from [facility]. Please contact [facility] at [phone] with questions or concerns regarding your invoice.  ° °Our billing staff will not be able to assist you with questions regarding bills from these companies. ° °You will be contacted with the lab results as soon as they are available. The fastest way to get your results is to activate your My Chart account. Instructions are located on the last page of this paperwork. If you have not heard from us regarding the results in 2 weeks, please contact this office. °  ° ° ° °

## 2020-04-06 ENCOUNTER — Encounter: Payer: Self-pay | Admitting: Registered Nurse

## 2020-04-06 NOTE — Progress Notes (Signed)
Acute Office Visit  Subjective:    Patient ID: Willie [name], male    DOB: 1960-11-23, 59 y.o.   MRN: [id number]  Chief Complaint  Patient presents with  . Shoulder Pain    patient states he has been having some pain in the shoulder off and on for since july. Per patient he has also had some pain down his arm with some numbness and tingling only on the left side. he has taken Ibuprofen but no relief.    HPI Patient is in today for L shoulder pain  Some radiates towards chest and abdomen, some radiates down arm. On and off pain. Occurring since July. Denies CV symptoms. Pain is easily reproducible. He is active, feels this is ortho/msk injury rather than CV origin.  No acute injury noted Pain is getting a little worse and more severe. Has tried OTCs without relief.  Past Medical History:  Diagnosis Date  . Allergy    seasonal  . GERD (gastroesophageal reflux disease)   . Hypertension    dr Merla Riches  . Umbilical hernia     Past Surgical History:  Procedure Laterality Date  . HERNIA REPAIR  01/2002  . UMBILICAL HERNIA REPAIR  09/01/2011   Procedure: HERNIA REPAIR UMBILICAL ADULT;  Surgeon: Shelly Rubenstein, MD;  Location: [facility] OR;  Service: General;  Laterality: N/A;    Family History  Problem Relation Age of Onset  . Cancer Father        lung  . Pneumonia Father   . Lung cancer Father   . Hypertension Mother   . Heart disease Mother   . Seizures Mother   . Healthy Sister   . Healthy Brother   . Cancer Maternal Grandmother   . Heart disease Paternal Grandfather   . Healthy Brother     Social History   Socioeconomic History  . Marital status: Married    Spouse name: Not on file  . Number of children: Not on file  . Years of education: Not on file  . Highest education level: Not on file  Occupational History  . Not on file  Tobacco Use  . Smoking status: Never Smoker  . Smokeless tobacco: Never Used  Substance and Sexual Activity  . Alcohol use: No  .  Drug use: No  . Sexual activity: Yes  Other Topics Concern  . Not on file  Social History Narrative  . Not on file   Social Determinants of Health   Financial Resource Strain:   . Difficulty of Paying Living Expenses: Not on file  Food Insecurity:   . Worried About Programme researcher, broadcasting/film/video in the Last Year: Not on file  . Ran Out of Food in the Last Year: Not on file  Transportation Needs:   . Lack of Transportation (Medical): Not on file  . Lack of Transportation (Non-Medical): Not on file  Physical Activity:   . Days of Exercise per Week: Not on file  . Minutes of Exercise per Session: Not on file  Stress:   . Feeling of Stress : Not on file  Social Connections:   . Frequency of Communication with Friends and Family: Not on file  . Frequency of Social Gatherings with Friends and Family: Not on file  . Attends Religious Services: Not on file  . Active Member of Clubs or Organizations: Not on file  . Attends Banker Meetings: Not on file  . Marital Status: Not on file  Intimate Partner Violence:   .  Fear of Current or Ex-Partner: Not on file  . Emotionally Abused: Not on file  . Physically Abused: Not on file  . Sexually Abused: Not on file    Outpatient Medications Prior to Visit  Medication Sig Dispense Refill  . amLODipine (NORVASC) 10 MG tablet Take 1 tablet (10 mg total) by mouth daily. 90 tablet 2  . cetirizine (ZYRTEC) 10 MG tablet Take 1 tablet (10 mg total) by mouth daily. 30 tablet 11  . diphenhydrAMINE (BENADRYL) 25 MG tablet Take 25 mg by mouth as needed.    . famotidine (PEPCID) 20 MG tablet Take 20 mg by mouth as needed for heartburn or indigestion.    Marland Kitchen losartan (COZAAR) 50 MG tablet Take 1 tablet (50 mg total) by mouth daily. 90 tablet 2  . Multiple Vitamins-Minerals (CENTRUM SILVER 50+MEN PO) Take by mouth daily.    . Pseudoephedrine-guaiFENesin (MUCINEX D MAX STRENGTH PO) Take by mouth as needed.     . doxycycline (VIBRA-TABS) 100 MG tablet Take  1 tablet (100 mg total) by mouth 2 (two) times daily. (Patient not taking: Reported on 12/17/2019) 20 tablet 0  . fluticasone (FLONASE) 50 MCG/ACT nasal spray Place 2 sprays into both nostrils daily. (Patient not taking: Reported on 06/18/2019) 48 g 3   No facility-administered medications prior to visit.    Allergies  Allergen Reactions  . Penicillins Hives, Nausea And Vomiting and Swelling    Review of Systems Per hpi      Objective:    Physical Exam Vitals and nursing note reviewed.  Constitutional:      Appearance: Normal appearance.  Cardiovascular:     Rate and Rhythm: Normal rate and regular rhythm.  Pulmonary:     Effort: Pulmonary effort is normal. No respiratory distress.  Musculoskeletal:        General: Tenderness (posterior shoulder, L trapezius) present. No swelling, deformity or signs of injury. Normal range of motion.     Right lower leg: No edema.     Left lower leg: No edema.     Comments: ROM wnl but elicits pain with active ROM exerises  Skin:    General: Skin is warm and dry.     Capillary Refill: Capillary refill takes less than 2 seconds.     Coloration: Skin is not jaundiced or pale.     Findings: No bruising, erythema, lesion or rash.  Neurological:     General: No focal deficit present.     Mental Status: He is alert. Mental status is at baseline.  Psychiatric:        Mood and Affect: Mood normal.        Behavior: Behavior normal.        Thought Content: Thought content normal.        Judgment: Judgment normal.     BP 134/81   Pulse 89   Temp 98.1 F (36.7 C) (Temporal)   Resp 18   Ht 5\' 10"  (1.778 m)   Wt 164 lb (74.4 kg)   SpO2 97%   BMI 23.53 kg/m  Wt Readings from Last 3 Encounters:  01/30/20 164 lb (74.4 kg)  12/17/19 163 lb (73.9 kg)  07/13/19 169 lb 9.6 oz (76.9 kg)    There are no preventive care reminders to display for this patient.  There are no preventive care reminders to display for this patient.   Lab Results   Component Value Date   TSH 1.340 06/15/2018   Lab Results  Component Value Date  WBC 7.1 05/09/2015   HGB 16.3 05/09/2015   HCT 47.1 05/09/2015   MCV 90.2 05/09/2015   PLT 269 08/30/2011   Lab Results  Component Value Date   NA 146 (H) 12/17/2019   K 4.7 12/17/2019   CO2 22 12/17/2019   GLUCOSE 103 (H) 12/17/2019   BUN 18 12/17/2019   CREATININE 1.11 12/17/2019   BILITOT 0.5 12/17/2019   ALKPHOS 65 12/17/2019   AST 19 12/17/2019   ALT 23 12/17/2019   PROT 7.0 12/17/2019   ALBUMIN 4.6 12/17/2019   CALCIUM 9.2 12/17/2019   Lab Results  Component Value Date   CHOL 197 12/17/2019   Lab Results  Component Value Date   HDL 42 12/17/2019   Lab Results  Component Value Date   LDLCALC 116 (H) 12/17/2019   Lab Results  Component Value Date   TRIG 226 (H) 12/17/2019   Lab Results  Component Value Date   CHOLHDL 4.7 12/17/2019   Lab Results  Component Value Date   HGBA1C 5.7 (H) 12/17/2019       Assessment & Plan:   Problem List Items Addressed This Visit    None    Visit Diagnoses    Strain of left trapezius muscle, initial encounter    -  Primary   Relevant Medications   predniSONE (DELTASONE) 10 MG tablet   methocarbamol (ROBAXIN) 500 MG tablet   meloxicam (MOBIC) 7.5 MG tablet       Meds ordered this encounter  Medications  . predniSONE (DELTASONE) 10 MG tablet    Sig: Take 3 tablets (30 mg total) by mouth daily with breakfast.    Dispense:  9 tablet    Refill:  0    Order Specific Question:   Supervising Provider    Answer:   Neva Seat, JEFFREY R [[id number]]  . methocarbamol (ROBAXIN) 500 MG tablet    Sig: Take 1 tablet (500 mg total) by mouth 4 (four) times daily.    Dispense:  60 tablet    Refill:  0    Order Specific Question:   Supervising Provider    Answer:   Neva Seat, JEFFREY R [[id number]]  . meloxicam (MOBIC) 7.5 MG tablet    Sig: Take 1 tablet (7.5 mg total) by mouth daily.    Dispense:  30 tablet    Refill:  0    Order Specific Question:    Supervising Provider    Answer:   Neva Seat, JEFFREY R [[id number]]   PLAN  Prednisone burst  meloxicam 7.5mg  PO qd PRN  Methocarbamol 500mg  PO qid PRN  Mild stretching, limit strenuous activity, RICE discussed.  PT or ortho referrals if no significant improvement in coming weeks.  Patient encouraged to call clinic with any questions, comments, or concerns.   , NP

## 2020-06-19 ENCOUNTER — Ambulatory Visit (INDEPENDENT_AMBULATORY_CARE_PROVIDER_SITE_OTHER): Payer: Managed Care, Other (non HMO) | Admitting: Family Medicine

## 2020-06-19 ENCOUNTER — Encounter: Payer: Self-pay | Admitting: Family Medicine

## 2020-06-19 ENCOUNTER — Other Ambulatory Visit: Payer: Self-pay

## 2020-06-19 VITALS — BP 138/85 | HR 84 | Temp 98.7°F | Ht 70.0 in | Wt 166.0 lb

## 2020-06-19 DIAGNOSIS — Z0001 Encounter for general adult medical examination with abnormal findings: Secondary | ICD-10-CM

## 2020-06-19 DIAGNOSIS — R7303 Prediabetes: Secondary | ICD-10-CM | POA: Diagnosis not present

## 2020-06-19 DIAGNOSIS — Z0000 Encounter for general adult medical examination without abnormal findings: Secondary | ICD-10-CM

## 2020-06-19 DIAGNOSIS — E782 Mixed hyperlipidemia: Secondary | ICD-10-CM

## 2020-06-19 DIAGNOSIS — E781 Pure hyperglyceridemia: Secondary | ICD-10-CM | POA: Diagnosis not present

## 2020-06-19 DIAGNOSIS — I10 Essential (primary) hypertension: Secondary | ICD-10-CM

## 2020-06-19 DIAGNOSIS — Z23 Encounter for immunization: Secondary | ICD-10-CM | POA: Diagnosis not present

## 2020-06-19 MED ORDER — LOSARTAN POTASSIUM 50 MG PO TABS: 50.0000 mg | ORAL_TABLET | Freq: Every day | ORAL | 2 refills | Status: DC

## 2020-06-19 MED ORDER — AMLODIPINE BESYLATE 10 MG PO TABS: 10.0000 mg | ORAL_TABLET | Freq: Every day | ORAL | 2 refills | Status: DC

## 2020-06-19 NOTE — Progress Notes (Signed)
Subjective:  Patient ID: Willie [name], male    DOB: May 23, 1961  Age: 60 y.o. MRN: [id number]  CC:  Chief Complaint  Patient presents with  . Annual Exam    Pt reports he feels well with no complaints at this time. Pt is currently fasting for labs.    HPI Willie [name] presents for   Annual physical exam  Hypertension: Amlodipine 10 mg, losartan 50 mg daily. No new side effects. No missed doses.  Home readings: 120-130/low 80's BP Readings from Last 3 Encounters:  06/19/20 138/85  01/30/20 134/81  12/17/19 125/83   Lab Results  Component Value Date   CREATININE 1.11 12/17/2019   Prediabetes Less exercise past month, prior was exercising regularly and holiday food. No soda/sweet tea. Minimal fast food, usually cooking at home.   Lab Results  Component Value Date   HGBA1C 5.7 (H) 12/17/2019   Wt Readings from Last 3 Encounters:  06/19/20 166 lb (75.3 kg)  01/30/20 164 lb (74.4 kg)  12/17/19 163 lb (73.9 kg)    Hyperlipidemia: Borderline ASCVD risk score previously.  No statin.  Lab Results  Component Value Date   CHOL 197 12/17/2019   HDL 42 12/17/2019   LDLCALC 116 (H) 12/17/2019   TRIG 226 (H) 12/17/2019   CHOLHDL 4.7 12/17/2019   Lab Results  Component Value Date   ALT 23 12/17/2019   AST 19 12/17/2019   ALKPHOS 65 12/17/2019   BILITOT 0.5 12/17/2019   Cancer screening Colonoscopy 06/20/2018 Prostate - no FH.  The natural history of prostate cancer and ongoing controversy regarding screening and potential treatment outcomes of prostate cancer has been discussed with the patient. The meaning of a false positive PSA and a false negative PSA has been discussed. He indicates understanding of the limitations of this screening test and wishes to defer screening PSA testing.  Lab Results  Component Value Date   PSA1 2.3 06/15/2018    Immunization History  Administered Date(s) Administered  . Influenza,inj,Quad PF,6+ Mos 06/18/2019  . Tdap 12/12/2017   COVID-19 vaccine: pfizer, with booster in November.  Shingles vaccine: has not had. Defers today - plans on at pharmacy.  Flu vaccine: not this year yet. Will get today.   Depression screen [facility] 2/9 06/19/2020 12/17/2019 07/13/2019 06/18/2019 12/13/2018  Decreased Interest 0 0 0 0 0  Down, Depressed, Hopeless 0 0 0 0 0  PHQ - 2 Score 0 0 0 0 0   No exam data present  optho appt last year.   Dental:  Every 6 months.   Exercise: Less recently Prior wlaking most days per week, home exercises, calisthenics.      History Patient Active Problem List   Diagnosis Date Noted  . 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 Allergy    seasonal  . GERD (gastroesophageal reflux disease)   . Hypertension    dr Merla Riches  . Umbilical hernia    Past Surgical History:  Procedure Laterality Date  . HERNIA REPAIR  01/2002  . UMBILICAL HERNIA REPAIR  09/01/2011   Procedure: HERNIA REPAIR UMBILICAL ADULT;  Surgeon: Shelly Rubenstein, MD;  Location: [facility] OR;  Service: General;  Laterality: N/A;   Allergies  Allergen Reactions  . Penicillins Hives, Nausea And Vomiting and Swelling   Prior to Admission medications   Medication Sig Start Date End Date Taking? Authorizing Provider  amLODipine (NORVASC) 10 MG tablet Take 1 tablet (10 mg total)  by mouth daily. 12/17/19  Yes Shade FloodGreene, Kirby Cortese R, MD  cetirizine (ZYRTEC) 10 MG tablet Take 1 tablet (10 mg total) by mouth daily. 04/07/17  Yes English, Judeth CornfieldStephanie D, PA  diphenhydrAMINE (BENADRYL) 25 MG tablet Take 25 mg by mouth as needed.   Yes [provider]  doxycycline (VIBRA-TABS) 100 MG tablet Take 1 tablet (100 mg total) by mouth 2 (two) times daily. 07/13/19  Yes Janeece AgeeMorrow, Richard, NP  famotidine (PEPCID) 20 MG tablet Take 20 mg by mouth as needed for heartburn or indigestion.   Yes [provider]  fluticasone (FLONASE) 50 MCG/ACT nasal spray Place 2 sprays into both nostrils daily. 09/08/18  Yes  Shade FloodGreene, Asami Lambright R, MD  losartan (COZAAR) 50 MG tablet Take 1 tablet (50 mg total) by mouth daily. 12/17/19  Yes Shade FloodGreene, Christinamarie Tall R, MD  Multiple Vitamins-Minerals (CENTRUM SILVER 50+MEN PO) Take by mouth daily.   Yes [provider]  meloxicam (MOBIC) 7.5 MG tablet Take 1 tablet (7.5 mg total) by mouth daily. Patient not taking: Reported on 06/19/2020 01/30/20   Janeece AgeeMorrow, Richard, NP  methocarbamol (ROBAXIN) 500 MG tablet Take 1 tablet (500 mg total) by mouth 4 (four) times daily. Patient not taking: Reported on 06/19/2020 01/30/20   Janeece AgeeMorrow, Richard, NP  predniSONE (DELTASONE) 10 MG tablet Take 3 tablets (30 mg total) by mouth daily with breakfast. Patient not taking: Reported on 06/19/2020 01/30/20   Janeece AgeeMorrow, Richard, NP  Pseudoephedrine-guaiFENesin [facility](MUCINEX D MAX STRENGTH PO) Take by mouth as needed.  Patient not taking: Reported on 06/19/2020    [provider]   Social History   Socioeconomic History  . Marital status: Married    Spouse name: Not on file  . Number of children: Not on file  . Years of education: Not on file  . Highest education level: Not on file  Occupational History  . Not on file  Tobacco Use  . Smoking status: Never Smoker  . Smokeless tobacco: Never Used  Substance and Sexual Activity  . Alcohol use: No  . Drug use: No  . Sexual activity: Yes  Other Topics Concern  . Not on file  Social History Narrative  . Not on file   Social Determinants of Health   Financial Resource Strain: Not on file  Food Insecurity: Not on file  Transportation Needs: Not on file  Physical Activity: Not on file  Stress: Not on file  Social Connections: Not on file  Intimate Partner Violence: Not on file    Review of Systems 13 point review of systems per patient health survey noted.  Negative other than as indicated above or in HPI.    Objective:   Vitals:   06/19/20 0820  BP: 138/85  Pulse: 84  Temp: 98.7 F (37.1 C)  TempSrc: Temporal  SpO2: 98%   Weight: 166 lb (75.3 kg)  Height: 5\' 10"  (1.778 m)     Physical Exam Vitals reviewed.  Constitutional:      Appearance: He is well-developed.  HENT:     Head: Normocephalic and atraumatic.     Right Ear: External ear normal.     Left Ear: External ear normal.  Eyes:     Conjunctiva/sclera: Conjunctivae normal.     Pupils: Pupils are equal, round, and reactive to light.  Neck:     Thyroid: No thyromegaly.  Cardiovascular:     Rate and Rhythm: Normal rate and regular rhythm.     Heart sounds: Normal heart sounds.  Pulmonary:  Effort: Pulmonary effort is normal. No respiratory distress.     Breath sounds: Normal breath sounds. No wheezing.  Abdominal:     General: There is no distension.     Palpations: Abdomen is soft.     Tenderness: There is no abdominal tenderness.  Musculoskeletal:        General: No tenderness. Normal range of motion.     Cervical back: Normal range of motion and neck supple.  Lymphadenopathy:     Cervical: No cervical adenopathy.  Skin:    General: Skin is warm and dry.  Neurological:     Mental Status: He is alert and oriented to person, place, and time.     Deep Tendon Reflexes: Reflexes are normal and symmetric.  Psychiatric:        Behavior: Behavior normal.      Assessment & Plan:  LATERRIAN HEVENER is a 60 y.o. male . Annual physical exam  - -anticipatory guidance as below in AVS, screening labs above. Health maintenance items as above in HPI discussed/recommended as applicable. Shingles vaccine at pharmacy.   Need for vaccination - Plan: Flu Vaccine QUAD 36+ mos IM  Prediabetes - Plan: Hemoglobin A1c  - increase exercise planned check labs.   Essential hypertension - Plan: Lipid panel, Comprehensive metabolic panel, amLODipine (NORVASC) 10 MG tablet, losartan (COZAAR) 50 MG tablet  -  Stable, tolerating current regimen. Medications refilled. Labs pending as above.   Hypertriglyceridemia Mixed hyperlipidemia  - check labs. ascvd  risk score. Increased exercise planned.   Meds ordered this encounter  Medications  . amLODipine (NORVASC) 10 MG tablet    Sig: Take 1 tablet (10 mg total) by mouth daily.    Dispense:  90 tablet    Refill:  2  . losartan (COZAAR) 50 MG tablet    Sig: Take 1 tablet (50 mg total) by mouth daily.    Dispense:  90 tablet    Refill:  2   Patient Instructions    Shingles vaccine when able at your pharmacy. Low intensity exercise most days per week. Goal of 150 minutes per week. I will check some labs.  Thanks for coming in today.   Keeping you healthy  Get these tests  Blood pressure- Have your blood pressure checked once a year by your healthcare provider.  Normal blood pressure is 120/80  Weight- Have your body mass index (BMI) calculated to screen for obesity.  BMI is a measure of body fat based on height and weight. You can also calculate your own BMI at [email or web address].  Cholesterol- Have your cholesterol checked every year.  Diabetes- Have your blood sugar checked regularly if you have high blood pressure, high cholesterol, have a family history of diabetes or if you are overweight.  Screening for Colon Cancer- Colonoscopy starting at age 63.  Screening may begin sooner depending on your family history and other health conditions. Follow up colonoscopy as directed by your Gastroenterologist.  Screening for Prostate Cancer- Both blood work (PSA) and a rectal exam help screen for Prostate Cancer.  Screening begins at age 67 with African-American men and at age 92 with Caucasian men.  Screening may begin sooner depending on your family history.   Take these medicines  Flu shot- Every fall.  Tetanus- Every 10 years.  Zostavax- Once after the age of 37 to prevent Shingles.  Pneumonia shot- Once after the age of 24; if you are younger than 80, ask your healthcare provider if you need a Pneumonia  shot.  Take these steps  Don't smoke- If you do smoke, talk to your  doctor about quitting.  For tips on how to quit, go to www.smokefree.gov or call 1-800-QUIT-NOW.  Be physically active- Exercise 5 days a week for at least 30 minutes.  If you are not already physically active start slow and gradually work up to 30 minutes of moderate physical activity.  Examples of moderate activity include walking briskly, mowing the yard, dancing, swimming, bicycling, etc.  Eat a healthy diet- Eat a variety of healthy food such as fruits, vegetables, low fat milk, low fat cheese, yogurt, lean meant, poultry, fish, beans, tofu, etc. For more information go to www.thenutritionsource.org  Drink alcohol in moderation- Limit alcohol intake to less than two drinks a day. Never drink and drive.  Dentist- Brush and floss twice daily; visit your dentist twice a year.  Depression- Your emotional health is as important as your physical health. If you're feeling down, or losing interest in things you would normally enjoy please talk to your healthcare provider.  Eye exam- Visit your eye doctor every year.  Safe sex- If you may be exposed to a sexually transmitted infection, use a condom.  Seat belts- Seat belts can save your life; always wear one.  Smoke/Carbon Monoxide detectors- These detectors need to be installed on the appropriate level of your home.  Replace batteries at least once a year.  Skin cancer- When out in the sun, cover up and use sunscreen 15 SPF or higher.  Violence- If anyone is threatening you, please tell your healthcare provider.  Living Will/ Health care power of attorney- Speak with your healthcare provider and family.   If you have lab work done today you will be contacted with your lab results within the next 2 weeks.  If you have not heard from [place] then please contact us. The fastest way to get your results is to register for My Chart.   IF you received an x-ray today, you will receive an invoice from [facility]. Please contact [facility]  Radiology at [phone] with questions or concerns regarding your invoice.   IF you received labwork today, you will receive an invoice from [place]. Please contact [facility] at [phone] with questions or concerns regarding your invoice.   Our billing staff will not be able to assist you with questions regarding bills from these companies.  You will be contacted with the lab results as soon as they are available. The fastest way to get your results is to activate your My Chart account. Instructions are located on the last page of this paperwork. If you have not heard from [place] regarding the results in 2 weeks, please contact this office.         Signed, Meredith Staggers, MD [facility]

## 2020-06-19 NOTE — Patient Instructions (Addendum)
Shingles vaccine when able at your pharmacy. Low intensity exercise most days per week. Goal of 150 minutes per week. I will check some labs.  Thanks for coming in today.   Keeping you healthy  Get these tests  Blood pressure- Have your blood pressure checked once a year by your healthcare provider.  Normal blood pressure is 120/80  Weight- Have your body mass index (BMI) calculated to screen for obesity.  BMI is a measure of body fat based on height and weight. You can also calculate your own BMI at [email or web address].  Cholesterol- Have your cholesterol checked every year.  Diabetes- Have your blood sugar checked regularly if you have high blood pressure, high cholesterol, have a family history of diabetes or if you are overweight.  Screening for Colon Cancer- Colonoscopy starting at age 70.  Screening may begin sooner depending on your family history and other health conditions. Follow up colonoscopy as directed by your Gastroenterologist.  Screening for Prostate Cancer- Both blood work (PSA) and a rectal exam help screen for Prostate Cancer.  Screening begins at age 97 with African-American men and at age 45 with Caucasian men.  Screening may begin sooner depending on your family history.   Take these medicines  Flu shot- Every fall.  Tetanus- Every 10 years.  Zostavax- Once after the age of 50 to prevent Shingles.  Pneumonia shot- Once after the age of 13; if you are younger than 29, ask your healthcare provider if you need a Pneumonia shot.  Take these steps  Don't smoke- If you do smoke, talk to your doctor about quitting.  For tips on how to quit, go to www.smokefree.gov or call 1-800-QUIT-NOW.  Be physically active- Exercise 5 days a week for at least 30 minutes.  If you are not already physically active start slow and gradually work up to 30 minutes of moderate physical activity.  Examples of moderate activity include walking briskly, mowing the yard, dancing,  swimming, bicycling, etc.  Eat a healthy diet- Eat a variety of healthy food such as fruits, vegetables, low fat milk, low fat cheese, yogurt, lean meant, poultry, fish, beans, tofu, etc. For more information go to www.thenutritionsource.org  Drink alcohol in moderation- Limit alcohol intake to less than two drinks a day. Never drink and drive.  Dentist- Brush and floss twice daily; visit your dentist twice a year.  Depression- Your emotional health is as important as your physical health. If you're feeling down, or losing interest in things you would normally enjoy please talk to your healthcare provider.  Eye exam- Visit your eye doctor every year.  Safe sex- If you may be exposed to a sexually transmitted infection, use a condom.  Seat belts- Seat belts can save your life; always wear one.  Smoke/Carbon Monoxide detectors- These detectors need to be installed on the appropriate level of your home.  Replace batteries at least once a year.  Skin cancer- When out in the sun, cover up and use sunscreen 15 SPF or higher.  Violence- If anyone is threatening you, please tell your healthcare provider.  Living Will/ Health care power of attorney- Speak with your healthcare provider and family.   If you have lab work done today you will be contacted with your lab results within the next 2 weeks.  If you have not heard from [place] then please contact us. The fastest way to get your results is to register for My Chart.   IF you received an x-ray today, you will  receive an Economist from [facility] Rad[facility]. Please contact [facility] at [phone] with questions or concerns regarding your invoice.   IF you received labwork today, you will receive an invoice from [facility]. Please contact [facility] at [phone] with questions or concerns regarding your invoice.   Our billing staff will not be able to assist you with questions regarding bills from these companies.  You will be contacted  with the lab results as soon as they are available. The fastest way to get your results is to activate your My Chart account. Instructions are located on the last page of this paperwork. If you have not heard from [place] regarding the results in 2 weeks, please contact this office.

## 2020-06-20 LAB — COMPREHENSIVE METABOLIC PANEL
ALT: 21 IU/L (ref 0–44)
AST: 17 IU/L (ref 0–40)
Albumin/Globulin Ratio: 1.8 (ref 1.2–2.2)
Albumin: 4.4 g/dL (ref 3.8–4.9)
Alkaline Phosphatase: 61 IU/L (ref 44–121)
BUN/Creatinine Ratio: 17 (ref 9–20)
BUN: 18 mg/dL (ref 6–24)
Bilirubin Total: 0.3 mg/dL (ref 0.0–1.2)
CO2: 24 mmol/L (ref 20–29)
Calcium: 9.9 mg/dL (ref 8.7–10.2)
Chloride: 104 mmol/L (ref 96–106)
Creatinine, Ser: 1.05 mg/dL (ref 0.76–1.27)
GFR calc Af Amer: 90 mL/min/{1.73_m2} (ref >59)
GFR calc non Af Amer: 78 mL/min/{1.73_m2} (ref >59)
Globulin, Total: 2.4 g/dL (ref 1.5–4.5)
Glucose: 106 mg/dL — ABNORMAL HIGH (ref 65–99)
Potassium: 4.5 mmol/L (ref 3.5–5.2)
Sodium: 141 mmol/L (ref 134–144)
Total Protein: 6.8 g/dL (ref 6.0–8.5)

## 2020-06-20 LAB — LIPID PANEL
Chol/HDL Ratio: 4.9 ratio (ref 0.0–5.0)
Cholesterol, Total: 211 mg/dL — ABNORMAL HIGH (ref 100–199)
HDL: 43 mg/dL (ref >39)
LDL Chol Calc (NIH): 120 mg/dL — ABNORMAL HIGH (ref 0–99)
Triglycerides: 271 mg/dL — ABNORMAL HIGH (ref 0–149)
VLDL Cholesterol Cal: 48 mg/dL — ABNORMAL HIGH (ref 5–40)

## 2020-06-20 LAB — HEMOGLOBIN A1C
Est. average glucose Bld gHb Est-mCnc: 114 mg/dL
Hgb A1c MFr Bld: 5.6 % (ref 4.8–5.6)

## 2020-06-30 ENCOUNTER — Encounter: Payer: Self-pay | Admitting: Family Medicine

## 2020-07-31 ENCOUNTER — Other Ambulatory Visit: Payer: Self-pay

## 2020-07-31 ENCOUNTER — Encounter: Payer: Self-pay | Admitting: Family Medicine

## 2020-07-31 ENCOUNTER — Ambulatory Visit (INDEPENDENT_AMBULATORY_CARE_PROVIDER_SITE_OTHER): Payer: Managed Care, Other (non HMO) | Admitting: Family Medicine

## 2020-07-31 VITALS — BP 138/84 | HR 91 | Temp 98.1°F | Ht 70.0 in | Wt 162.0 lb

## 2020-07-31 DIAGNOSIS — M25612 Stiffness of left shoulder, not elsewhere classified: Secondary | ICD-10-CM | POA: Diagnosis not present

## 2020-07-31 DIAGNOSIS — M7918 Myalgia, other site: Secondary | ICD-10-CM

## 2020-07-31 DIAGNOSIS — M25512 Pain in left shoulder: Secondary | ICD-10-CM | POA: Diagnosis not present

## 2020-07-31 MED ORDER — PREDNISONE 20 MG PO TABS: 40.0000 mg | ORAL_TABLET | Freq: Every day | ORAL | 0 refills | Status: DC

## 2020-07-31 MED ORDER — CYCLOBENZAPRINE HCL 5 MG PO TABS: ORAL_TABLET | ORAL | 0 refills | Status: DC

## 2020-07-31 NOTE — Patient Instructions (Addendum)
  Shoulder pain could be coming from multiple areas including spasm or pinched nerve from the neck, rotator cuff or shoulder joint itself as well as the deltoid muscle where you received vaccine.  Start prednisone 2 pills/day for the next 5 days, do not take that with any over-the-counter medications except Tylenol.  Muscle relaxant Flexeril up to 3 times per day but that can cause sedation so start with that at bedtime.  Recheck in 2 weeks, sooner if worse   If you have lab work done today you will be contacted with your lab results within the next 2 weeks.  If you have not heard from [place] then please contact us. The fastest way to get your results is to register for My Chart.   IF you received an x-ray today, you will receive an invoice from [facility]. Please contact [facility] at [phone] with questions or concerns regarding your invoice.   IF you received labwork today, you will receive an invoice from [place]. Please contact [facility] at [phone] with questions or concerns regarding your invoice.   Our billing staff will not be able to assist you with questions regarding bills from these companies.  You will be contacted with the lab results as soon as they are available. The fastest way to get your results is to activate your My Chart account. Instructions are located on the last page of this paperwork. If you have not heard from [place] regarding the results in 2 weeks, please contact this office.

## 2020-07-31 NOTE — Progress Notes (Signed)
Subjective:  Patient ID: Willie [name], male    DOB: 1960/10/27  Age: 60 y.o. MRN: [id number]  CC:  Chief Complaint  Patient presents with  . vaccine reaction    Pt reports he has had extreme pain in his L arm at the vaccination site the past 2 years. This is after the flu vaccine each time. PT reports the pain is still bothering him since last OV. PT reports some loss of range of motion in his L arm. PT reports pain while trying to sleep.    HPI GREOGRY GOODWYN presents for   Left arm pain: Reports pain in his left arm at vaccination site past 2 years, notes after a flu vaccine each episode.  Had flu vaccine given June 19, 2020. Sore on day of vaccine, more sore next day. Sore at night - treated with ice/heat, advil - some slight relief. sore to move. Decreased motion about a week or two later. Feels worse - less range, and sharp feeling with certain movements. Slight radiation to elbow at times. Some soreness to lateral neck and shoulder blade on left. Some spasms in shoulder at times. No prior shoulder/neck surgery.  No fever, redness, rash, swelling or bruising of area.   Right-hand-dominant.  Notes reviewed, he was evaluated in August 2021 for left shoulder pain off and on since July. Pain down arm at that time  Treated with prednisone, Robaxin, Mobic for left trapezius strain. Resolved most of symptoms, some residual soreness.   History Patient Active Problem List   Diagnosis Date Noted  . 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 Allergy    seasonal  . GERD (gastroesophageal reflux disease)   . Hypertension    dr Merla Riches  . Umbilical hernia    Past Surgical History:  Procedure Laterality Date  . HERNIA REPAIR  01/2002  . UMBILICAL HERNIA REPAIR  09/01/2011   Procedure: HERNIA REPAIR UMBILICAL ADULT;  Surgeon: Shelly Rubenstein, MD;  Location: [facility] OR;  Service: General;  Laterality: N/A;   Allergies  Allergen Reactions  .  Penicillins Hives, Nausea And Vomiting and Swelling   Prior to Admission medications   Medication Sig Start Date End Date Taking? Authorizing Provider  amLODipine (NORVASC) 10 MG tablet Take 1 tablet (10 mg total) by mouth daily. 06/19/20  Yes Shade Flood, MD  cetirizine (ZYRTEC) 10 MG tablet Take 1 tablet (10 mg total) by mouth daily. 04/07/17  Yes English, Judeth Cornfield D, PA  diphenhydrAMINE (BENADRYL) 25 MG tablet Take 25 mg by mouth as needed.   Yes [provider]  famotidine (PEPCID) 20 MG tablet Take 20 mg by mouth as needed for heartburn or indigestion.   Yes [provider]  fluticasone (FLONASE) 50 MCG/ACT nasal spray Place 2 sprays into both nostrils daily. 09/08/18  Yes Shade Flood, MD  losartan (COZAAR) 50 MG tablet Take 1 tablet (50 mg total) by mouth daily. 06/19/20  Yes Shade Flood, MD  Multiple Vitamins-Minerals (CENTRUM SILVER 50+MEN PO) Take by mouth daily.   Yes [provider]   Social History   Socioeconomic History  . Marital status: Married    Spouse name: Not on file  . Number of children: Not on file  . Years of education: Not on file  . Highest education level: Not on file  Occupational History  . Not on file  Tobacco Use  . Smoking status: Never Smoker  . Smokeless tobacco:  Never Used  Substance and Sexual Activity  . Alcohol use: No  . Drug use: No  . Sexual activity: Yes  Other Topics Concern  . Not on file  Social History Narrative  . Not on file   Social Determinants of Health   Financial Resource Strain: Not on file  Food Insecurity: Not on file  Transportation Needs: Not on file  Physical Activity: Not on file  Stress: Not on file  Social Connections: Not on file  Intimate Partner Violence: Not on file    Review of Systems  Constitutional: Negative for chills and fever.  Musculoskeletal: Positive for arthralgias, myalgias and neck stiffness. Negative for joint swelling.  Skin: Negative for color  change, rash and wound.     Objective:   Vitals:   07/31/20 1500  BP: 138/84  Pulse: 91  Temp: 98.1 F (36.7 C)  TempSrc: Temporal  SpO2: 99%  Weight: 162 lb (73.5 kg)  Height: 5\' 10"  (1.778 m)     Physical Exam Constitutional:      General: He is not in acute distress.    Appearance: He is well-developed and well-nourished.  HENT:     Head: Normocephalic and atraumatic.  Cardiovascular:     Rate and Rhythm: Normal rate.  Pulmonary:     Effort: Pulmonary effort is normal.  Musculoskeletal:     Comments: C-spine, no midline bony tenderness.  Slight stiffness with decreased range of motion diffusely.  No radiating pain down arm.  Clavicle, [place], AC nontender, no focal bony tenderness.  Left shoulder tender to palpation over mid deltoid without apparent soft tissue swelling, defect, mass or skin changes.  Somewhat guarded exam with decreased range of motion, internal rotation to approximately L4, abduction, flexion approximately 90 to 100 degrees but guarded.  Positive [facility].  Pain with RTC testing but primarily at lateral deltoid.  Intact strength with empty can, rotator cuff testing except discomfort with liftoff.  Skin:    General: Skin is warm and dry.     Findings: No bruising, erythema, lesion or rash.  Neurological:     Mental Status: He is alert and oriented to person, place, and time.  Psychiatric:        Mood and Affect: Mood and affect normal.     Assessment & Plan:  [name] is a 60 y.o. male . Acute pain of left shoulder - Plan: predniSONE (DELTASONE) 20 MG tablet, cyclobenzaprine (FLEXERIL) 5 MG tablet  Decreased range of motion of shoulder, left - Plan: predniSONE (DELTASONE) 20 MG tablet, cyclobenzaprine (FLEXERIL) 5 MG tablet  Possible combination of inflammation from injection with previous discomfort possibly stemming from rotator cuff syndrome versus early adhesive capsulitis, along with radiculopathy, cervical source with spasm.  No apparent  wound, defect, mass on exam.  No radicular symptoms to hands or contralateral side.  -Trial of prednisone, Flexeril, recheck in 2 weeks to decide on imaging versus trial of injection versus Ortho eval.  RTC precautions if worse   Meds ordered this encounter  Medications  . predniSONE (DELTASONE) 20 MG tablet    Sig: Take 2 tablets (40 mg total) by mouth daily with breakfast.    Dispense:  10 tablet    Refill:  0  . cyclobenzaprine (FLEXERIL) 5 MG tablet    Sig: 1 pill by mouth up to every 8 hours as needed. Start with one pill by mouth each bedtime as needed due to sedation    Dispense:  15 tablet  Refill:  0   Patient Instructions    Shoulder pain could be coming from multiple areas including spasm or pinched nerve from the neck, rotator cuff or shoulder joint itself as well as the deltoid muscle where you received vaccine.  Start prednisone 2 pills/day for the next 5 days, do not take that with any over-the-counter medications except Tylenol.  Muscle relaxant Flexeril up to 3 times per day but that can cause sedation so start with that at bedtime.  Recheck in 2 weeks, sooner if worse   If you have lab work done today you will be contacted with your lab results within the next 2 weeks.  If you have not heard from [place] then please contact us. The fastest way to get your results is to register for My Chart.   IF you received an x-ray today, you will receive an invoice from [facility] Radiology. Please contact [facility]iology at [phone] with questions or concerns regarding your invoice.   IF you received labwork today, you will receive an invoice from [place]. Please contact [facility] at [phone] with questions or concerns regarding your invoice.   Our billing staff will not be able to assist you with questions regarding bills from these companies.  You will be contacted with the lab results as soon as they are available. The fastest way to get your results is to activate  your My Chart account. Instructions are located on the last page of this paperwork. If you have not heard from [place] regarding the results in 2 weeks, please contact this office.         Signed, Meredith Staggers, MD [facility]

## 2020-08-22 ENCOUNTER — Ambulatory Visit (INDEPENDENT_AMBULATORY_CARE_PROVIDER_SITE_OTHER): Payer: Managed Care, Other (non HMO) | Admitting: Family Medicine

## 2020-08-22 ENCOUNTER — Other Ambulatory Visit: Payer: Self-pay

## 2020-08-22 ENCOUNTER — Encounter: Payer: Self-pay | Admitting: Family Medicine

## 2020-08-22 VITALS — BP 132/88 | HR 98 | Temp 98.3°F | Ht 70.0 in | Wt 162.0 lb

## 2020-08-22 DIAGNOSIS — M25512 Pain in left shoulder: Secondary | ICD-10-CM | POA: Diagnosis not present

## 2020-08-22 DIAGNOSIS — M7502 Adhesive capsulitis of left shoulder: Secondary | ICD-10-CM

## 2020-08-22 MED ORDER — TRAMADOL HCL 50 MG PO TABS: 50.0000 mg | ORAL_TABLET | Freq: Three times a day (TID) | ORAL | 0 refills | Status: AC | PRN

## 2020-08-22 NOTE — Patient Instructions (Addendum)
I am glad to hear that the neck and upper shoulder/back symptoms have improved.  I suspect that was due to some muscle spasm or pinched nerve.  Current shoulder pain is likely due to adhesive capsulitis or a frozen shoulder.  I will refer you to a shoulder specialist to evaluate that area further including possible injection or physical therapy.  Try gentle range of motion as possible until that visit if that is tolerated.  Tylenol if needed, ice over the area if it feels better temporarily, no more than 10 minutes at a time and over a towel.  Tramadol if needed for more severe pain.  Return to the clinic or go to the nearest emergency room if any of your symptoms worsen or new symptoms occur.   Adhesive Capsulitis  Adhesive capsulitis, also called frozen shoulder, causes the shoulder to become stiff and painful to move. This condition happens when there is inflammation of the tendons and ligaments that surround the shoulder joint (shoulder capsule). What are the causes? This condition may be caused by:  An injury to your shoulder joint.  Straining your shoulder.  Not moving your shoulder for a period of time. This can happen if your arm was injured or in a sling.  Long-standing conditions, such as: ? Diabetes. ? Thyroid problems. ? Heart disease. ? Stroke. ? Rheumatoid arthritis. ? Lung disease. In some cases, the cause is not known. What increases the risk? You are more likely to develop this condition if you are:  A woman.  Older than 59 years of age. What are the signs or symptoms? Symptoms of this condition include:  Pain in your shoulder when you move your arm. There may also be pain when parts of your shoulder are touched. The pain may be worse at night or when you are resting.  A sore or aching shoulder.  The inability to move your shoulder normally.  Muscle spasms. How is this diagnosed? This condition is diagnosed with a physical exam and imaging tests, such as  an X-ray or MRI. How is this treated? This condition may be treated with:  Treatment of the underlying cause or condition.  Medicine. Medicine may be given to relieve pain, inflammation, or muscle spasms.  Steroid injections into the shoulder joint.  Physical therapy. This involves performing exercises to get the shoulder moving again.  Acupuncture. This is a type of treatment that involves stimulating specific points on your body by inserting thin needles through your skin.  Shoulder manipulation. This is a procedure to move the shoulder into another position. It is done after you are given a medicine to make you fall asleep (general anesthetic). The joint may also be injected with salt water at high pressure to break down scarring.  Surgery. This may be done in severe cases when other treatments have failed. Although most people recover completely from adhesive capsulitis, some may not regain full shoulder movement. Follow these instructions at home: Managing pain, stiffness, and swelling  If directed, put ice on the injured area: ? Put ice in a plastic bag. ? Place a towel between your skin and the bag. ? Leave the ice on for 20 minutes, 2-3 times per day.  If directed, apply heat to the affected area before you exercise. Use the heat source that your health care provider recommends, such as a moist heat pack or a heating pad. ? Place a towel between your skin and the heat source. ? Leave the heat on for 20-30 minutes. ?  Remove the heat if your skin turns bright red. This is especially important if you are unable to feel pain, heat, or cold. You may have a greater risk of getting burned.      General instructions  Take over-the-counter and prescription medicines only as told by your health care provider.  If you are being treated with physical therapy, follow instructions from your physical therapist.  Avoid exercises that put a lot of demand on your shoulder, such as  throwing. These exercises can make pain worse.  Keep all follow-up visits as told by your health care provider. This is important. Contact a health care provider if:  You develop new symptoms.  Your symptoms get worse. Summary  Adhesive capsulitis, also called frozen shoulder, causes the shoulder to become stiff and painful to move.  You are more likely to have this condition if you are a woman and over age 86.  It is treated with physical therapy, medicines, and sometimes surgery. This information is not intended to replace advice given to you by your health care provider. Make sure you discuss any questions you have with your health care provider. Document Revised: 10/28/2017 Document Reviewed: 10/28/2017 Elsevier Patient Education  2021 [employer].    If you have lab work done today you will be contacted with your lab results within the next 2 weeks.  If you have not heard from [place] then please contact us. The fastest way to get your results is to register for My Chart.   IF you received an x-ray today, you will receive an invoice from [facility] Radiology. Please contact [facility] Radiology at [phone] with questions or concerns regarding your invoice.   IF you received labwork today, you will receive an invoice from [place]. Please contact [facility] at [phone] with questions or concerns regarding your invoice.   Our billing staff will not be able to assist you with questions regarding bills from these companies.  You will be contacted with the lab results as soon as they are available. The fastest way to get your results is to activate your My Chart account. Instructions are located on the last page of this paperwork. If you have not heard from [place] regarding the results in 2 weeks, please contact this office.

## 2020-08-22 NOTE — Progress Notes (Signed)
Subjective:  Patient ID: Willie [name], male    DOB: 06/07/1960  Age: 60 y.o. MRN: [id number]  CC:  Chief Complaint  Patient presents with  . Follow-up    On L shoulder pain. Pt reports the pain has gotten a little worse since last OV. No new sympoms in the shoulder. Pain is a little more intense and more constant.    HPI Willie MatterGeorge M [name] presents for  Left shoulder pain Discussed at February 24 visit.  Has noted since mid January, soon after flu vaccine.  Decreased range of motion.  Has also been seen in August 2021 for intermittent left shoulder pain off and on since July of that year with some possible radicular symptoms treated with prednisone and Robaxin, Mobic. At last visit thought to have combination of inflammation from injection, possible underlying rotator cuff syndrome versus early adhesive capsulitis along with possible radiculopathy with cervical source/spasm.  Initial treatment of prednisone, Flexeril.  Pain has persisted, possibly a little worse. Less motion. More sore - and noticing about 75% of the time vs 50% last visit. Neck/upper back is better after prednisone, flexeril. Shoulder pain has persisted. No radiating pain down arm - staying in shoulder only now, min soreness into upper arm only.  Feels a little weak, but more with pain limiting use.  [facility] - [facility] for back in past.   No unexplained night sweats, fevers or weight loss, no rash.   R hand dominant.   Tx: none   History Patient Active Problem List   Diagnosis Date Noted  . 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 Allergy    seasonal  . GERD (gastroesophageal reflux disease)   . Hypertension    dr Merla Richesdoolittle  . Umbilical hernia    Past Surgical History:  Procedure Laterality Date  . HERNIA REPAIR  01/2002  . UMBILICAL HERNIA REPAIR  09/01/2011   Procedure: HERNIA REPAIR UMBILICAL ADULT;  Surgeon: Shelly Rubensteinouglas A Blackman, MD;  Location: [facility] OR;  Service:  General;  Laterality: N/A;   Allergies  Allergen Reactions  . Penicillins Hives, Nausea And Vomiting and Swelling   Prior to Admission medications   Medication Sig Start Date End Date Taking? Authorizing Provider  amLODipine (NORVASC) 10 MG tablet Take 1 tablet (10 mg total) by mouth daily. 06/19/20  Yes Shade FloodGreene, Sina Sumpter R, MD  cetirizine (ZYRTEC) 10 MG tablet Take 1 tablet (10 mg total) by mouth daily. 04/07/17  Yes English, Judeth CornfieldStephanie D, PA  cyclobenzaprine (FLEXERIL) 5 MG tablet 1 pill by mouth up to every 8 hours as needed. Start with one pill by mouth each bedtime as needed due to sedation 07/31/20  Yes Shade FloodGreene, Fabio Wah R, MD  diphenhydrAMINE (BENADRYL) 25 MG tablet Take 25 mg by mouth as needed.   Yes [provider]  famotidine (PEPCID) 20 MG tablet Take 20 mg by mouth as needed for heartburn or indigestion.   Yes [provider]  fluticasone (FLONASE) 50 MCG/ACT nasal spray Place 2 sprays into both nostrils daily. 09/08/18  Yes Shade FloodGreene, Ludger Bones R, MD  losartan (COZAAR) 50 MG tablet Take 1 tablet (50 mg total) by mouth daily. 06/19/20  Yes Shade FloodGreene, Nuria Phebus R, MD  Multiple Vitamins-Minerals (CENTRUM SILVER 50+MEN PO) Take by mouth daily.   Yes [provider]  predniSONE (DELTASONE) 20 MG tablet Take 2 tablets (40 mg total) by mouth daily with breakfast. 07/31/20  Yes Shade FloodGreene, Kalief Kattner R, MD   Social History  Socioeconomic History  . Marital status: Married    Spouse name: Not on file  . Number of children: Not on file  . Years of education: Not on file  . Highest education level: Not on file  Occupational History  . Not on file  Tobacco Use  . Smoking status: Never Smoker  . Smokeless tobacco: Never Used  Substance and Sexual Activity  . Alcohol use: No  . Drug use: No  . Sexual activity: Yes  Other Topics Concern  . Not on file  Social History Narrative  . Not on file   Social Determinants of Health   Financial Resource Strain: Not on file  Food  Insecurity: Not on file  Transportation Needs: Not on file  Physical Activity: Not on file  Stress: Not on file  Social Connections: Not on file  Intimate Partner Violence: Not on file    Review of Systems Per HPI  Objective:   Vitals:   08/22/20 1019  BP: 132/88  Pulse: 98  Temp: 98.3 F (36.8 C)  TempSrc: Temporal  SpO2: 93%  Weight: 162 lb (73.5 kg)  Height: 5\' 10"  (1.778 m)     Physical Exam Vitals reviewed.  Constitutional:      General: He is not in acute distress.    Appearance: He is well-developed.  HENT:     Head: Normocephalic and atraumatic.  Cardiovascular:     Rate and Rhythm: Normal rate.  Pulmonary:     Effort: Pulmonary effort is normal.  Musculoskeletal:     Comments: C-spine no bony tenderness, paraspinals nontender.  Left shoulder: [place], AC, clavicle nontender.  Some discomfort over the lateral deltoid and posterior shoulder.  No focal bony tenderness.  Neurovascular intact distally, opposition strength intact at fingers as well as thumb strength. Active range of motion with flexion limited to 80 to 90 degrees, abduction 80 to 90 degrees, internal rotation to just past his lateral hip at belt line.  Discomfort with empty can but able to resist.  Slight weakness versus pain with external rotation testing, internal rotation equal to right.  Positive Neer, Hawkins.  Neurological:     Mental Status: He is alert and oriented to person, place, and time.        Assessment & Plan:  Willie [name] is a 60 y.o. male . Left shoulder pain, unspecified chronicity - Plan: traMADol (ULTRAM) 50 MG tablet, Ambulatory referral to Orthopedic Surgery  Adhesive capsulitis of left shoulder - Plan: traMADol (ULTRAM) 50 MG tablet, Ambulatory referral to Orthopedic Surgery  Initial combined picture of likely cervical radiculopathy with some spasm of his paraspinals, now that area has improved and appears to be more of a adhesive capsulitis picture.  Persistent  symptoms, slight worsening of discomfort.    - Will refer to orthopedic surgeon for evaluation, likely injection versus PT.  Range of motion, symptomatic care discussed in the meantime, Tylenol, tramadol if needed for more severe pain with RTC precautions given.  Potential side effects and risks of meds have been discussed.  Meds ordered this encounter  Medications  . traMADol (ULTRAM) 50 MG tablet    Sig: Take 1 tablet (50 mg total) by mouth every 8 (eight) hours as needed for up to 5 days.    Dispense:  15 tablet    Refill:  0   Patient Instructions    I am glad to hear that the neck and upper shoulder/back symptoms have improved.  I suspect that was due to some  muscle spasm or pinched nerve.  Current shoulder pain is likely due to adhesive capsulitis or a frozen shoulder.  I will refer you to a shoulder specialist to evaluate that area further including possible injection or physical therapy.  Try gentle range of motion as possible until that visit if that is tolerated.  Tylenol if needed, ice over the area if it feels better temporarily, no more than 10 minutes at a time and over a towel.  Tramadol if needed for more severe pain.  Return to the clinic or go to the nearest emergency room if any of your symptoms worsen or new symptoms occur.   Adhesive Capsulitis  Adhesive capsulitis, also called frozen shoulder, causes the shoulder to become stiff and painful to move. This condition happens when there is inflammation of the tendons and ligaments that surround the shoulder joint (shoulder capsule). What are the causes? This condition may be caused by:  An injury to your shoulder joint.  Straining your shoulder.  Not moving your shoulder for a period of time. This can happen if your arm was injured or in a sling.  Long-standing conditions, such as: ? Diabetes. ? Thyroid problems. ? Heart disease. ? Stroke. ? Rheumatoid arthritis. ? Lung disease. In some cases, the cause is not  known. What increases the risk? You are more likely to develop this condition if you are:  A woman.  Older than 59 years of age. What are the signs or symptoms? Symptoms of this condition include:  Pain in your shoulder when you move your arm. There may also be pain when parts of your shoulder are touched. The pain may be worse at night or when you are resting.  A sore or aching shoulder.  The inability to move your shoulder normally.  Muscle spasms. How is this diagnosed? This condition is diagnosed with a physical exam and imaging tests, such as an X-ray or MRI. How is this treated? This condition may be treated with:  Treatment of the underlying cause or condition.  Medicine. Medicine may be given to relieve pain, inflammation, or muscle spasms.  Steroid injections into the shoulder joint.  Physical therapy. This involves performing exercises to get the shoulder moving again.  Acupuncture. This is a type of treatment that involves stimulating specific points on your body by inserting thin needles through your skin.  Shoulder manipulation. This is a procedure to move the shoulder into another position. It is done after you are given a medicine to make you fall asleep (general anesthetic). The joint may also be injected with salt water at high pressure to break down scarring.  Surgery. This may be done in severe cases when other treatments have failed. Although most people recover completely from adhesive capsulitis, some may not regain full shoulder movement. Follow these instructions at home: Managing pain, stiffness, and swelling  If directed, put ice on the injured area: ? Put ice in a plastic bag. ? Place a towel between your skin and the bag. ? Leave the ice on for 20 minutes, 2-3 times per day.  If directed, apply heat to the affected area before you exercise. Use the heat source that your health care provider recommends, such as a moist heat pack or a heating  pad. ? Place a towel between your skin and the heat source. ? Leave the heat on for 20-30 minutes. ? Remove the heat if your skin turns bright red. This is especially important if you are unable to feel pain,  heat, or cold. You may have a greater risk of getting burned.      General instructions  Take over-the-counter and prescription medicines only as told by your health care provider.  If you are being treated with physical therapy, follow instructions from your physical therapist.  Avoid exercises that put a lot of demand on your shoulder, such as throwing. These exercises can make pain worse.  Keep all follow-up visits as told by your health care provider. This is important. Contact a health care provider if:  You develop new symptoms.  Your symptoms get worse. Summary  Adhesive capsulitis, also called frozen shoulder, causes the shoulder to become stiff and painful to move.  You are more likely to have this condition if you are a woman and over age 44.  It is treated with physical therapy, medicines, and sometimes surgery. This information is not intended to replace advice given to you by your health care provider. Make sure you discuss any questions you have with your health care provider. Document Revised: 10/28/2017 Document Reviewed: 10/28/2017 Elsevier Patient Education  2021 [employer].    If you have lab work done today you will be contacted with your lab results within the next 2 weeks.  If you have not heard from [place] then please contact us. The fastest way to get your results is to register for My Chart.   IF you received an x-ray today, you will receive an invoice from [facility]. Please contact [facility] at [phone] with questions or concerns regarding your invoice.   IF you received labwork today, you will receive an invoice from [facility]. Please contact [facility] at [phone] with questions or concerns regarding your invoice.    Our billing staff will not be able to assist you with questions regarding bills from these companies.  You will be contacted with the lab results as soon as they are available. The fastest way to get your results is to activate your My Chart account. Instructions are located on the last page of this paperwork. If you have not heard from [place] regarding the results in 2 weeks, please contact this office.         Signed, Meredith Staggers, MD [facility]

## 2020-09-13 DIAGNOSIS — M7502 Adhesive capsulitis of left shoulder: Secondary | ICD-10-CM | POA: Insufficient documentation

## 2020-12-03 ENCOUNTER — Telehealth (INDEPENDENT_AMBULATORY_CARE_PROVIDER_SITE_OTHER): Payer: Managed Care, Other (non HMO) | Admitting: Family Medicine

## 2020-12-03 ENCOUNTER — Encounter: Payer: Self-pay | Admitting: Family Medicine

## 2020-12-03 VITALS — Temp 101.2°F

## 2020-12-03 DIAGNOSIS — U071 COVID-19: Secondary | ICD-10-CM | POA: Diagnosis not present

## 2020-12-03 MED ORDER — MOLNUPIRAVIR EUA 200MG CAPSULE: 4.0000 | ORAL_CAPSULE | Freq: Two times a day (BID) | ORAL | 0 refills | Status: AC

## 2020-12-03 NOTE — Patient Instructions (Signed)
Mucinex for cough, continue tylenol for fever and body aches. Drink fluids and rest as needed.

## 2020-12-03 NOTE — Progress Notes (Signed)
Virtual Visit via Video Note  I connected with Willie [name] on 12/03/20 at 11:39 AM by a video enabled telemedicine application and verified that I am speaking with the correct person using two identifiers.  Patient location: home My location: office - [place]   I discussed the limitations, risks, security and privacy concerns of performing an evaluation and management service by telephone and the availability of in person appointments. I also discussed with the patient that there may be a patient responsible charge related to this service. The patient expressed understanding and agreed to proceed, consent obtained  Chief complaint:  Chief Complaint  Patient presents with   Covid Positive    Tested positive this morning, symptoms started 6/27 Head congestion, body aches, fevers, dry cough     History of Present Illness: Willie [name] is a 60 y.o. male  COVID-19 infection Initial symptoms 2 days ago - just felt off. Wife recently sick with covid 19 infection. Has been trying to isolate. Negative rapid tests past 2 days. Positive today.  Yesterday dry cough, fever- tmax 101.4, body aches, head congestion.  Some chest congestion, mostly in head no dyspnea.  No confusion, no difficulty drinking fluids.  COVID risk of complications for 2 COVID-vaccine: pfizer primary and booster in November 2021.  Tx: tylenol. Benadryl at times.   Patient Active Problem List   Diagnosis Date Noted   Recurrent umbilical hernia 06/29/2011   Past Medical History:  Diagnosis Date   Allergy    seasonal   GERD (gastroesophageal reflux disease)    Hypertension    dr Merla Riches   Umbilical hernia    Past Surgical History:  Procedure Laterality Date   HERNIA REPAIR  01/2002   UMBILICAL HERNIA REPAIR  09/01/2011   Procedure: HERNIA REPAIR UMBILICAL ADULT;  Surgeon: Shelly Rubenstein, MD;  Location: [facility] OR;  Service: General;  Laterality: N/A;   Allergies  Allergen Reactions   Penicillins  Hives, Nausea And Vomiting and Swelling   Prior to Admission medications   Medication Sig Start Date End Date Taking? Authorizing Provider  amLODipine (NORVASC) 10 MG tablet Take 1 tablet (10 mg total) by mouth daily. 06/19/20  Yes Shade Flood, MD  cetirizine (ZYRTEC) 10 MG tablet Take 1 tablet (10 mg total) by mouth daily. 04/07/17  Yes English, Judeth Cornfield D, PA  cyclobenzaprine (FLEXERIL) 5 MG tablet 1 pill by mouth up to every 8 hours as needed. Start with one pill by mouth each bedtime as needed due to sedation 07/31/20  Yes Shade Flood, MD  diphenhydrAMINE (BENADRYL) 25 MG tablet Take 25 mg by mouth as needed.   Yes [provider]  famotidine (PEPCID) 20 MG tablet Take 20 mg by mouth as needed for heartburn or indigestion.   Yes [provider]  fluticasone (FLONASE) 50 MCG/ACT nasal spray Place 2 sprays into both nostrils daily. 09/08/18  Yes Shade Flood, MD  losartan (COZAAR) 50 MG tablet Take 1 tablet (50 mg total) by mouth daily. 06/19/20  Yes Shade Flood, MD  Multiple Vitamins-Minerals (CENTRUM SILVER 50+MEN PO) Take by mouth daily.   Yes [provider]  predniSONE (DELTASONE) 20 MG tablet Take 2 tablets (40 mg total) by mouth daily with breakfast. 07/31/20   Shade Flood, MD   Social History   Socioeconomic History   Marital status: Married    Spouse name: Not on file   Number of children: Not on file   Years of education: Not  on file   Highest education level: Not on file  Occupational History   Not on file  Tobacco Use   Smoking status: Never   Smokeless tobacco: Never  Substance and Sexual Activity   Alcohol use: No   Drug use: No   Sexual activity: Yes  Other Topics Concern   Not on file  Social History Narrative   Not on file   Social Determinants of Health   Financial Resource Strain: Not on file  Food Insecurity: Not on file  Transportation Needs: Not on file  Physical Activity: Not on file  Stress: Not on  file  Social Connections: Not on file  Intimate Partner Violence: Not on file    Observations/Objective: Nontoxic appearance.  Speaking in full sentences, no respiratory distress.  Euthymic mood.  No cough during exam.  Appropriate responses.  All questions were answered with understanding of plan expressed  Assessment and Plan: COVID-19 virus infection - Plan: molnupiravir EUA 200 mg CAPS Symptomatic care discussed with Mucinex, Tylenol for body aches or fever, fluids and rest.  Isolation and masking precautions discussed per CDC recommendations.  Antivirals discussed and chose molnupiravir.  potential side effects discussed.  ER precautions given.  Follow Up Instructions: As needed.  With ER precautions   I discussed the assessment and treatment plan with the patient. The patient was provided an opportunity to ask questions and all were answered. The patient agreed with the plan and demonstrated an understanding of the instructions.   The patient was advised to call back or seek an in-person evaluation if the symptoms worsen or if the condition fails to improve as anticipated.  I provided 17 minutes of non-face-to-face time during this encounter.   Shade Flood, MD

## 2020-12-17 ENCOUNTER — Other Ambulatory Visit: Payer: Self-pay

## 2020-12-17 ENCOUNTER — Ambulatory Visit (INDEPENDENT_AMBULATORY_CARE_PROVIDER_SITE_OTHER): Payer: Managed Care, Other (non HMO) | Admitting: Family Medicine

## 2020-12-17 VITALS — BP 126/72 | HR 81 | Temp 98.3°F | Resp 15 | Ht 70.0 in | Wt 159.6 lb

## 2020-12-17 DIAGNOSIS — E782 Mixed hyperlipidemia: Secondary | ICD-10-CM | POA: Diagnosis not present

## 2020-12-17 DIAGNOSIS — I10 Essential (primary) hypertension: Secondary | ICD-10-CM

## 2020-12-17 DIAGNOSIS — E781 Pure hyperglyceridemia: Secondary | ICD-10-CM | POA: Diagnosis not present

## 2020-12-17 LAB — COMPREHENSIVE METABOLIC PANEL
ALT: 23 U/L (ref 0–53)
AST: 18 U/L (ref 0–37)
Albumin: 4.4 g/dL (ref 3.5–5.2)
Alkaline Phosphatase: 52 U/L (ref 39–117)
BUN: 16 mg/dL (ref 6–23)
CO2: 29 mEq/L (ref 19–32)
Calcium: 9.6 mg/dL (ref 8.4–10.5)
Chloride: 102 mEq/L (ref 96–112)
Creatinine, Ser: 1.17 mg/dL (ref 0.40–1.50)
GFR: 68.47 mL/min (ref >60.00)
Glucose, Bld: 90 mg/dL (ref 70–99)
Potassium: 4.8 mEq/L (ref 3.5–5.1)
Sodium: 139 mEq/L (ref 135–145)
Total Bilirubin: 0.7 mg/dL (ref 0.2–1.2)
Total Protein: 7 g/dL (ref 6.0–8.3)

## 2020-12-17 LAB — LDL CHOLESTEROL, DIRECT: Direct LDL: 124 mg/dL

## 2020-12-17 LAB — LIPID PANEL
Cholesterol: 215 mg/dL — ABNORMAL HIGH (ref 0–200)
HDL: 47.8 mg/dL (ref >39.00)
NonHDL: 167.24
Total CHOL/HDL Ratio: 4
Triglycerides: 222 mg/dL — ABNORMAL HIGH (ref 0.0–149.0)
VLDL: 44.4 mg/dL — ABNORMAL HIGH (ref 0.0–40.0)

## 2020-12-17 LAB — HEMOGLOBIN A1C: Hgb A1c MFr Bld: 6 % (ref 4.6–6.5)

## 2020-12-17 MED ORDER — LOSARTAN POTASSIUM 50 MG PO TABS: 50.0000 mg | ORAL_TABLET | Freq: Every day | ORAL | 2 refills | Status: DC

## 2020-12-17 MED ORDER — AMLODIPINE BESYLATE 10 MG PO TABS: 10.0000 mg | ORAL_TABLET | Freq: Every day | ORAL | 2 refills | Status: DC

## 2020-12-17 NOTE — Progress Notes (Signed)
Subjective:  Patient ID: Willie [name], male    DOB: [birth year]  Age: 60 y.o. MRN: [id number]  CC:  Chief Complaint  Patient presents with   Hypertension    Pt here today for recheck, will soon need refill on amlodipine denies physical sxs   Prediabetes    Pt a1c abnormal due for recheck no concerns    Hyperlipidemia    Pt here for recheck and will soon need refill, pt is fasting today for lab work.     HPI Willie [name] presents for   Hypertension: Amlodipine 10 mg daily, losartan 50 mg daily, no new side effects.  Home readings: 120-130/70-80. BP Readings from Last 3 Encounters:  12/17/20 126/72  08/22/20 132/88  07/31/20 138/84   Lab Results  Component Value Date   CREATININE 1.05 06/19/2020   Prediabetes: Borderline prediabetic back in January on labs.  He has lost a few pounds since that time. Watching diet, exercising more- some running few days per week, walking. Down few pounds on scale at home.  FH of diabetes - paternal cousin, father possibly.   Lab Results  Component Value Date   HGBA1C 5.6 06/19/2020   Wt Readings from Last 3 Encounters:  12/17/20 159 lb 9.6 oz (72.4 kg)  08/22/20 162 lb (73.5 kg)  07/31/20 162 lb (73.5 kg)   Hyperlipidemia: 10-year ASCVD risk score of 11% in January.  Option of low-dose statin, but initially attempted exercise, diet changes with recheck today.  Has lost a few pounds as above with lifestyle modifications including cutting back on sugar more salads, natural foods, less sodas.  Lab Results  Component Value Date   CHOL 211 (H) 06/19/2020   HDL 43 06/19/2020   LDLCALC 120 (H) 06/19/2020   TRIG 271 (H) 06/19/2020   CHOLHDL 4.9 06/19/2020   Lab Results  Component Value Date   ALT 21 06/19/2020   AST 17 06/19/2020   ALKPHOS 61 06/19/2020   BILITOT 0.3 06/19/2020   Would like to defer shingles vaccine today.  History Patient Active Problem List   Diagnosis Date Noted   Recurrent umbilical hernia 06/29/2011   Past  Medical History:  Diagnosis Date   Allergy    seasonal   GERD (gastroesophageal reflux disease)    Hypertension    dr Merla Riches   Umbilical hernia    Past Surgical History:  Procedure Laterality Date   HERNIA REPAIR  01/2002   UMBILICAL HERNIA REPAIR  09/01/2011   Procedure: HERNIA REPAIR UMBILICAL ADULT;  Surgeon: Shelly Rubenstein, MD;  Location: [facility] OR;  Service: General;  Laterality: N/A;   Allergies  Allergen Reactions   Penicillins Hives, Nausea And Vomiting and Swelling   Prior to Admission medications   Medication Sig Start Date End Date Taking? Authorizing Provider  amLODipine (NORVASC) 10 MG tablet Take 1 tablet (10 mg total) by mouth daily. 06/19/20  Yes Shade Flood, MD  cetirizine (ZYRTEC) 10 MG tablet Take 1 tablet (10 mg total) by mouth daily. 04/07/17  Yes English, Judeth Cornfield D, PA  diphenhydrAMINE (BENADRYL) 25 MG tablet Take 25 mg by mouth as needed.   Yes [provider]  famotidine (PEPCID) 20 MG tablet Take 20 mg by mouth as needed for heartburn or indigestion.   Yes [provider]  fluticasone (FLONASE) 50 MCG/ACT nasal spray Place 2 sprays into both nostrils daily. 09/08/18  Yes Shade Flood, MD  losartan (COZAAR) 50 MG tablet Take 1 tablet (50 mg total) by  mouth daily. 06/19/20  Yes Shade Flood, MD  Multiple Vitamins-Minerals (CENTRUM SILVER 50+MEN PO) Take by mouth daily.   Yes [provider]   Social History   Socioeconomic History   Marital status: Married    Spouse name: Not on file   Number of children: Not on file   Years of education: Not on file   Highest education level: Not on file  Occupational History   Not on file  Tobacco Use   Smoking status: Never   Smokeless tobacco: Never  Substance and Sexual Activity   Alcohol use: No   Drug use: No   Sexual activity: Yes  Other Topics Concern   Not on file  Social History Narrative   Not on file   Social Determinants of Health   Financial Resource  Strain: Not on file  Food Insecurity: Not on file  Transportation Needs: Not on file  Physical Activity: Not on file  Stress: Not on file  Social Connections: Not on file  Intimate Partner Violence: Not on file    Review of Systems  Constitutional:  Negative for fatigue and unexpected weight change.  Eyes:  Negative for visual disturbance.  Respiratory:  Negative for cough, chest tightness and shortness of breath.   Cardiovascular:  Negative for chest pain, palpitations and leg swelling.  Gastrointestinal:  Negative for abdominal pain and blood in stool.  Neurological:  Negative for dizziness, light-headedness and headaches.    Objective:   Vitals:   12/17/20 0933  BP: 126/72  Pulse: 81  Resp: 15  Temp: 98.3 F (36.8 C)  TempSrc: Temporal  SpO2: 97%  Weight: 159 lb 9.6 oz (72.4 kg)  Height: 5\' 10"  (1.778 m)     Physical Exam Vitals reviewed.  Constitutional:      Appearance: He is well-developed.  HENT:     Head: Normocephalic and atraumatic.  Neck:     Vascular: No carotid bruit or JVD.  Cardiovascular:     Rate and Rhythm: Normal rate and regular rhythm.     Heart sounds: Normal heart sounds. No murmur heard. Pulmonary:     Effort: Pulmonary effort is normal.     Breath sounds: Normal breath sounds. No rales.  Musculoskeletal:     Right lower leg: No edema.     Left lower leg: No edema.  Skin:    General: Skin is warm and dry.  Neurological:     Mental Status: He is alert and oriented to person, place, and time.  Psychiatric:        Mood and Affect: Mood normal.       Assessment & Plan:  Willie [name] is a 60 y.o. male . Mixed hyperlipidemia - Plan: Comprehensive metabolic panel, Lipid panel  Essential hypertension - Plan: Comprehensive metabolic panel, amLODipine (NORVASC) 10 MG tablet, losartan (COZAAR) 50 MG tablet  Hypertriglyceridemia - Plan: Comprehensive metabolic panel, Hemoglobin A1c  Commended on positive health changes with change in  diet and exercise.  Anticipate his lipids and A1c will be improved.  Blood pressure stable.  Continue same meds as above, check labs, 80-month follow-up.  Meds ordered this encounter  Medications   amLODipine (NORVASC) 10 MG tablet    Sig: Take 1 tablet (10 mg total) by mouth daily.    Dispense:  90 tablet    Refill:  2   losartan (COZAAR) 50 MG tablet    Sig: Take 1 tablet (50 mg total) by mouth daily.    Dispense:  90 tablet    Refill:  2   Patient Instructions  Great job on the weight loss and change in diet/exercise.  I expect your labs to be much better.  No change in medications for now.  Follow-up in 6 months but please let me know if there are questions sooner.     Signed,   Meredith Staggers, MD [facility], [facility] 12/17/20 10:19 AM

## 2020-12-17 NOTE — Patient Instructions (Signed)
Great job on the weight loss and change in diet/exercise.  I expect your labs to be much better.  No change in medications for now.  Follow-up in 6 months but please let me know if there are questions sooner.

## 2021-01-27 ENCOUNTER — Other Ambulatory Visit: Payer: Self-pay | Admitting: Orthopedic Surgery

## 2021-01-27 DIAGNOSIS — M25512 Pain in left shoulder: Secondary | ICD-10-CM

## 2021-01-28 ENCOUNTER — Other Ambulatory Visit: Payer: Self-pay

## 2021-01-28 ENCOUNTER — Ambulatory Visit
Admission: RE | Admit: 2021-01-28 | Discharge: 2021-01-28 | Disposition: A | Discharge status: Home / Self Care | Payer: Managed Care, Other (non HMO) | Source: Ambulatory Visit | Attending: Orthopedic Surgery | Admitting: Orthopedic Surgery

## 2021-01-28 DIAGNOSIS — M25512 Pain in left shoulder: Secondary | ICD-10-CM

## 2021-01-28 MED ORDER — IOPAMIDOL (ISOVUE-M 200) INJECTION 41%
1.0000 mL | Freq: Once | INTRAMUSCULAR | Status: AC
  Administered 2021-01-28: 1 mL via INTRATHECAL

## 2021-01-28 MED ORDER — METHYLPREDNISOLONE ACETATE 40 MG/ML INJ SUSP (RADIOLOG
80.0000 mg | Freq: Once | INTRAMUSCULAR | Status: AC
  Administered 2021-01-28: 80 mg via INTRA_ARTICULAR

## 2021-06-19 ENCOUNTER — Ambulatory Visit (INDEPENDENT_AMBULATORY_CARE_PROVIDER_SITE_OTHER): Payer: Managed Care, Other (non HMO) | Admitting: Family Medicine

## 2021-06-19 ENCOUNTER — Encounter: Payer: Self-pay | Admitting: Family Medicine

## 2021-06-19 VITALS — BP 122/84 | HR 70 | Temp 98.3°F | Resp 16 | Ht 70.0 in | Wt 159.2 lb

## 2021-06-19 DIAGNOSIS — E782 Mixed hyperlipidemia: Secondary | ICD-10-CM | POA: Diagnosis not present

## 2021-06-19 DIAGNOSIS — Z0000 Encounter for general adult medical examination without abnormal findings: Secondary | ICD-10-CM

## 2021-06-19 DIAGNOSIS — I10 Essential (primary) hypertension: Secondary | ICD-10-CM

## 2021-06-19 DIAGNOSIS — R7303 Prediabetes: Secondary | ICD-10-CM | POA: Diagnosis not present

## 2021-06-19 DIAGNOSIS — Z130 Encounter for screening for diseases of the blood and blood-forming organs and certain disorders involving the immune mechanism: Secondary | ICD-10-CM

## 2021-06-19 DIAGNOSIS — Z8249 Family history of ischemic heart disease and other diseases of the circulatory system: Secondary | ICD-10-CM

## 2021-06-19 LAB — CBC WITH DIFFERENTIAL/PLATELET
Basophils Absolute: 0 10*3/uL (ref 0.0–0.1)
Basophils Relative: 0.4 % (ref 0.0–3.0)
Eosinophils Absolute: 0.1 10*3/uL (ref 0.0–0.7)
Eosinophils Relative: 2.7 % (ref 0.0–5.0)
HCT: 47.4 % (ref 39.0–52.0)
Hemoglobin: 15.7 g/dL (ref 13.0–17.0)
Lymphocytes Relative: 39 % (ref 12.0–46.0)
Lymphs Abs: 1.9 10*3/uL (ref 0.7–4.0)
MCHC: 33.1 g/dL (ref 30.0–36.0)
MCV: 95 fl (ref 78.0–100.0)
Monocytes Absolute: 0.4 10*3/uL (ref 0.1–1.0)
Monocytes Relative: 8.7 % (ref 3.0–12.0)
Neutro Abs: 2.4 10*3/uL (ref 1.4–7.7)
Neutrophils Relative %: 49.2 % (ref 43.0–77.0)
Platelets: 230 10*3/uL (ref 150.0–400.0)
RBC: 4.98 Mil/uL (ref 4.22–5.81)
RDW: 12.7 % (ref 11.5–15.5)
WBC: 4.9 10*3/uL (ref 4.0–10.5)

## 2021-06-19 LAB — COMPREHENSIVE METABOLIC PANEL
ALT: 22 U/L (ref 0–53)
AST: 18 U/L (ref 0–37)
Albumin: 4.3 g/dL (ref 3.5–5.2)
Alkaline Phosphatase: 56 U/L (ref 39–117)
BUN: 20 mg/dL (ref 6–23)
CO2: 29 mEq/L (ref 19–32)
Calcium: 9.3 mg/dL (ref 8.4–10.5)
Chloride: 103 mEq/L (ref 96–112)
Creatinine, Ser: 1.11 mg/dL (ref 0.40–1.50)
GFR: 72.68 mL/min (ref >60.00)
Glucose, Bld: 97 mg/dL (ref 70–99)
Potassium: 3.9 mEq/L (ref 3.5–5.1)
Sodium: 140 mEq/L (ref 135–145)
Total Bilirubin: 0.6 mg/dL (ref 0.2–1.2)
Total Protein: 7.3 g/dL (ref 6.0–8.3)

## 2021-06-19 LAB — LIPID PANEL
Cholesterol: 205 mg/dL — ABNORMAL HIGH (ref 0–200)
HDL: 45.8 mg/dL (ref >39.00)
NonHDL: 158.74
Total CHOL/HDL Ratio: 4
Triglycerides: 295 mg/dL — ABNORMAL HIGH (ref 0.0–149.0)
VLDL: 59 mg/dL — ABNORMAL HIGH (ref 0.0–40.0)

## 2021-06-19 LAB — HEMOGLOBIN A1C: Hgb A1c MFr Bld: 5.9 % (ref 4.6–6.5)

## 2021-06-19 LAB — LDL CHOLESTEROL, DIRECT: Direct LDL: 103 mg/dL

## 2021-06-19 NOTE — Progress Notes (Signed)
Subjective:  Patient ID: Willie [name], male    DOB: 05/27/1961  Age: 61 y.o. MRN: [id number]  CC:  Chief Complaint  Patient presents with   Annual Exam    HPI GENO SYDNOR presents for   Annual exam.   Hypertension: Amlodipine 10mg , losartan 50mg  qd.  No new side effects.no leg swelling.   Home readings:120/70-80 BP Readings from Last 3 Encounters:  06/19/21 122/84  12/17/20 126/72  08/22/20 132/88   Lab Results  Component Value Date   CREATININE 1.17 12/17/2020   Prediabetes: Less exercise last few months, some indoor activity.plans to restart bowling. Still going on walks/hikes as weather permits.  No sweet tea, soda, fast food.  Lab Results  Component Value Date   HGBA1C 6.0 12/17/2020   Wt Readings from Last 3 Encounters:  06/19/21 159 lb 3.2 oz (72.2 kg)  12/17/20 159 lb 9.6 oz (72.4 kg)  08/22/20 162 lb (73.5 kg)  GERD: Flair every few months - pepcid as needed only. Aware of triggers.   Hyperlipidemia: No statin. Diet/exercise approach.  Mom with early CAD, MI in 65's.  The 10-year ASCVD risk score (Arnett DK, et al., 2019) is: 9.4%   Values used to calculate the score:     Age: 53 years     Sex: Male     Is Non-Hispanic African American: No     Diabetic: No     Tobacco smoker: No     Systolic Blood Pressure: 122 mmHg     Is BP treated: Yes     HDL Cholesterol: 47.8 mg/dL     Total Cholesterol: 215 mg/dL  Lab Results  Component Value Date   CHOL 215 (H) 12/17/2020   HDL 47.80 12/17/2020   LDLCALC 120 (H) 06/19/2020   LDLDIRECT 124.0 12/17/2020   TRIG 222.0 (H) 12/17/2020   CHOLHDL 4 12/17/2020   Lab Results  Component Value Date   ALT 23 12/17/2020   AST 18 12/17/2020   ALKPHOS 52 12/17/2020   BILITOT 0.7 12/17/2020   Depression screen PHQ 2/9 06/19/2021 12/17/2020 08/22/2020 07/31/2020 07/31/2020  Decreased Interest 0 0 0 0 0  Down, Depressed, Hopeless 0 0 0 0 0  PHQ - 2 Score 0 0 0 0 0  Altered sleeping 0 - - - -  Tired, decreased  energy 0 - - - -  Change in appetite 0 - - - -  Feeling bad or failure about yourself  0 - - - -  Trouble concentrating 0 - - - -  Moving slowly or fidgety/restless 0 - - - -  Suicidal thoughts 0 - - - -  PHQ-9 Score 0 - - - -  Difficult doing work/chores Not difficult at all - - - -   Cancer screening: Colonoscopy 06/20/18 - repeat 59yrs.  Lab Results  Component Value Date   PSA1 2.3 06/15/2018  No fh of prostate CA.  The natural history of prostate cancer and ongoing controversy regarding screening and potential treatment outcomes of prostate cancer has been discussed with the patient. The meaning of a false positive PSA and a false negative PSA has been discussed. He indicates understanding of the limitations of this screening test and wishes to defer screening PSA testing.  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 Treated by Dr. Ranell Patrick.  Defers shingrix to next visit.  Covid  vaccine - had primary vaccine, booster x 2.   No results found. Reading glasses. Last optho eval last year.   Dentist 2 years ago - plans on appt this year.   Alcohol: None  Tobacco: None.   Exercise- walking, hiking as above. Less than 150min now but working towards that level. Sports in past, softball. Limited by shoulder.   History Patient Active Problem List   Diagnosis Date Noted   Recurrent umbilical hernia 06/29/2011   Past Medical History:  Diagnosis Date   Allergy    seasonal   GERD (gastroesophageal reflux disease)    Hypertension    dr Merla Richesdoolittle   Umbilical hernia    Past Surgical History:  Procedure Laterality Date   HERNIA REPAIR  01/2002   UMBILICAL HERNIA REPAIR  09/01/2011   Procedure: HERNIA REPAIR UMBILICAL ADULT;  Surgeon: Shelly Rubensteinouglas A Blackman, MD;  Location: [facility] OR;  Service: General;  Laterality: N/A;    Allergies  Allergen Reactions   Penicillins Hives, Nausea And Vomiting and Swelling   Prior to Admission medications   Medication Sig Start Date End Date Taking? Authorizing Provider  amLODipine (NORVASC) 10 MG tablet Take 1 tablet (10 mg total) by mouth daily. 12/17/20  Yes Shade FloodGreene, Khristi Schiller R, MD  cetirizine (ZYRTEC) 10 MG tablet Take 1 tablet (10 mg total) by mouth daily. 04/07/17  Yes English, Judeth CornfieldStephanie D, PA  diphenhydrAMINE (BENADRYL) 25 MG tablet Take 25 mg by mouth as needed.   Yes [provider]  famotidine (PEPCID) 20 MG tablet Take 20 mg by mouth as needed for heartburn or indigestion.   Yes [provider]  losartan (COZAAR) 50 MG tablet Take 1 tablet (50 mg total) by mouth daily. 12/17/20  Yes Shade FloodGreene, Sanel Stemmer R, MD  Multiple Vitamins-Minerals (CENTRUM SILVER 50+MEN PO) Take by mouth daily.   Yes [provider]   Social History   Socioeconomic History   Marital status: Married    Spouse name: Not on file   Number of children: Not on file   Years of education: Not on file   Highest education level: Not on file  Occupational History   Not on file  Tobacco Use   Smoking status: Never   Smokeless tobacco: Never  Substance and Sexual Activity   Alcohol use: No   Drug use: No   Sexual activity: Yes  Other Topics Concern   Not on file  Social History Narrative   Not on file   Social Determinants of Health   Financial Resource Strain: Not on file  Food Insecurity: Not on file  Transportation Needs: Not on file  Physical Activity: Not on file  Stress: Not on file  Social Connections: Not on file  Intimate Partner Violence: Not on file    Review of Systems 13 point review of systems per patient health survey noted.  Negative other than as indicated above or in HPI.    Objective:   Vitals:   06/19/21 0750  BP: 122/84  Pulse: 70  Resp: 16  Temp: 98.3 F (36.8 C)  SpO2: 99%  Weight: 159 lb 3.2 oz (72.2 kg)  Height: 5\' 10"  (1.778  m)     Physical Exam Vitals reviewed.  Constitutional:      Appearance: He is well-developed.  HENT:     Head: Normocephalic and atraumatic.     Right Ear: External ear normal.     Left Ear: External ear normal.  Eyes:     Conjunctiva/sclera: Conjunctivae normal.  Pupils: Pupils are equal, round, and reactive to light.  Neck:     Thyroid: No thyromegaly.  Cardiovascular:     Rate and Rhythm: Normal rate and regular rhythm.     Heart sounds: Normal heart sounds.  Pulmonary:     Effort: Pulmonary effort is normal. No respiratory distress.     Breath sounds: Normal breath sounds. No wheezing.  Abdominal:     General: There is no distension.     Palpations: Abdomen is soft.     Tenderness: There is no abdominal tenderness.  Musculoskeletal:        General: No tenderness. Normal range of motion.     Cervical back: Normal range of motion and neck supple.  Lymphadenopathy:     Cervical: No cervical adenopathy.  Skin:    General: Skin is warm and dry.  Neurological:     Mental Status: He is alert and oriented to person, place, and time.     Deep Tendon Reflexes: Reflexes are normal and symmetric.  Psychiatric:        Behavior: Behavior normal.    Assessment & Plan:  VISHRUTH SEOANE is a 60 y.o. male . Annual physical exam  - -anticipatory guidance as below in AVS, screening labs above. Health maintenance items as above in HPI discussed/recommended as applicable.   Prediabetes - Plan: Hemoglobin A1c  -Plans on increased exercise, diet has been going well.  Check A1c.  Hold on meds for now.  Essential hypertension - Plan: Comprehensive metabolic panel  -Stable, tolerating current regimen, continue same.  Check labs.  Mixed hyperlipidemia - Plan: Lipid panel Family history of early CAD  -Slight elevated ASCVD risk score but also with early family history of CAD.  Discussed statin recommendation, even intermittent statin may be helpful if elevated lipids persist.  Option of  coronary calcium scoring but again deferred at this time, but would consider statin.  Check labs.   Screening for deficiency anemia - Plan: CBC with Differential/Platelet   No orders of the defined types were placed in this encounter.  Patient Instructions  Depending on cholesterol, I would consider taking a statin given your family history. We also have option of coronary calcium scoring test to help decide.   No change in BP meds for now.   I do recommend flu vaccine as soon as possible.   Thanks for coming in today.   Preventive Care 20-73 Years Old, Male Preventive care refers to lifestyle choices and visits with your health care provider that can promote health and wellness. Preventive care visits are also called wellness exams. What can I expect for my preventive care visit? Counseling During your preventive care visit, your health care provider may ask about your: Medical history, including: Past medical problems. Family medical history. Current health, including: Emotional well-being. Home life and relationship well-being. Sexual activity. Lifestyle, including: Alcohol, nicotine or tobacco, and drug use. Access to firearms. Diet, exercise, and sleep habits. Safety issues such as seatbelt and bike helmet use. Sunscreen use. Work and work Astronomer. Physical exam Your health care provider will check your: Height and weight. These may be used to calculate your BMI (body mass index). BMI is a measurement that tells if you are at a healthy weight. Waist circumference. This measures the distance around your waistline. This measurement also tells if you are at a healthy weight and may help predict your risk of certain diseases, such as type 2 diabetes and high blood pressure. Heart rate and blood  pressure. Body temperature. Skin for abnormal spots. What immunizations do I need? Vaccines are usually given at various ages, according to a schedule. Your health care provider  will recommend vaccines for you based on your age, medical history, and lifestyle or other factors, such as travel or where you work. What tests do I need? Screening Your health care provider may recommend screening tests for certain conditions. This may include: Lipid and cholesterol levels. Diabetes screening. This is done by checking your blood sugar (glucose) after you have not eaten for a while (fasting). Hepatitis B test. Hepatitis C test. HIV (human immunodeficiency virus) test. STI (sexually transmitted infection) testing, if you are at risk. Lung cancer screening. Prostate cancer screening. Colorectal cancer screening. Talk with your health care provider about your test results, treatment options, and if necessary, the need for more tests. Follow these instructions at home: Eating and drinking  Eat a diet that includes fresh fruits and vegetables, whole grains, lean protein, and low-fat dairy products. Take vitamin and mineral supplements as recommended by your health care provider. Do not drink alcohol if your health care provider tells you not to drink. If you drink alcohol: Limit how much you have to 0-2 drinks a day. Know how much alcohol is in your drink. In the [place] one drink equals one 12 oz bottle of beer (355 mL), one 5 oz glass of wine (148 mL), or one 1 oz glass of hard liquor (44 mL). Lifestyle Brush your teeth every morning and night with fluoride toothpaste. Floss one time each day. Exercise for at least 30 minutes 5 or more days each week. Do not use any products that contain nicotine or tobacco. These products include cigarettes, chewing tobacco, and vaping devices, such as e-cigarettes. If you need help quitting, ask your health care provider. Do not use drugs. If you are sexually active, practice safe sex. Use a condom or other form of protection to prevent STIs. Take aspirin only as told by your health care provider. Make sure that you understand how much to  take and what form to take. Work with your health care provider to find out whether it is safe and beneficial for you to take aspirin daily. Find healthy ways to manage stress, such as: Meditation, yoga, or listening to music. Journaling. Talking to a trusted person. Spending time with friends and family. Minimize exposure to UV radiation to reduce your risk of skin cancer. Safety Always wear your seat belt while driving or riding in a vehicle. Do not drive: If you have been drinking alcohol. Do not ride with someone who has been drinking. When you are tired or distracted. While texting. If you have been using any mind-altering substances or drugs. Wear a helmet and other protective equipment during sports activities. If you have firearms in your house, make sure you follow all gun safety procedures. What's next? Go to your health care provider once a year for an annual wellness visit. Ask your health care provider how often you should have your eyes and teeth checked. Stay up to date on all vaccines. This information is not intended to replace advice given to you by your health care provider. Make sure you discuss any questions you have with your health care provider. Document Revised: 11/19/2020 Document Reviewed: 11/19/2020 Elsevier Patient Education  2022 [employer]               Signed,   Meredith StaggersJeffrey Sayed Apostol, MD [facility], [facility] 06/19/21 8:41 AM

## 2021-06-19 NOTE — Patient Instructions (Addendum)
Depending on cholesterol, I would consider taking a statin given your family history. We also have option of coronary calcium scoring test to help decide.   No change in BP meds for now.   I do recommend flu vaccine as soon as possible.   Thanks for coming in today.   Preventive Care 27-60 Years Old, Male Preventive care refers to lifestyle choices and visits with your health care provider that can promote health and wellness. Preventive care visits are also called wellness exams. What can I expect for my preventive care visit? Counseling During your preventive care visit, your health care provider may ask about your: Medical history, including: Past medical problems. Family medical history. Current health, including: Emotional well-being. Home life and relationship well-being. Sexual activity. Lifestyle, including: Alcohol, nicotine or tobacco, and drug use. Access to firearms. Diet, exercise, and sleep habits. Safety issues such as seatbelt and bike helmet use. Sunscreen use. Work and work Astronomer. Physical exam Your health care provider will check your: Height and weight. These may be used to calculate your BMI (body mass index). BMI is a measurement that tells if you are at a healthy weight. Waist circumference. This measures the distance around your waistline. This measurement also tells if you are at a healthy weight and may help predict your risk of certain diseases, such as type 2 diabetes and high blood pressure. Heart rate and blood pressure. Body temperature. Skin for abnormal spots. What immunizations do I need? Vaccines are usually given at various ages, according to a schedule. Your health care provider will recommend vaccines for you based on your age, medical history, and lifestyle or other factors, such as travel or where you work. What tests do I need? Screening Your health care provider may recommend screening tests for certain conditions. This may  include: Lipid and cholesterol levels. Diabetes screening. This is done by checking your blood sugar (glucose) after you have not eaten for a while (fasting). Hepatitis B test. Hepatitis C test. HIV (human immunodeficiency virus) test. STI (sexually transmitted infection) testing, if you are at risk. Lung cancer screening. Prostate cancer screening. Colorectal cancer screening. Talk with your health care provider about your test results, treatment options, and if necessary, the need for more tests. Follow these instructions at home: Eating and drinking  Eat a diet that includes fresh fruits and vegetables, whole grains, lean protein, and low-fat dairy products. Take vitamin and mineral supplements as recommended by your health care provider. Do not drink alcohol if your health care provider tells you not to drink. If you drink alcohol: Limit how much you have to 0-2 drinks a day. Know how much alcohol is in your drink. In the [place] one drink equals one 12 oz bottle of beer (355 mL), one 5 oz glass of wine (148 mL), or one 1 oz glass of hard liquor (44 mL). Lifestyle Brush your teeth every morning and night with fluoride toothpaste. Floss one time each day. Exercise for at least 30 minutes 5 or more days each week. Do not use any products that contain nicotine or tobacco. These products include cigarettes, chewing tobacco, and vaping devices, such as e-cigarettes. If you need help quitting, ask your health care provider. Do not use drugs. If you are sexually active, practice safe sex. Use a condom or other form of protection to prevent STIs. Take aspirin only as told by your health care provider. Make sure that you understand how much to take and what form to take. Work  with your health care provider to find out whether it is safe and beneficial for you to take aspirin daily. Find healthy ways to manage stress, such as: Meditation, yoga, or listening to music. Journaling. Talking to a  trusted person. Spending time with friends and family. Minimize exposure to UV radiation to reduce your risk of skin cancer. Safety Always wear your seat belt while driving or riding in a vehicle. Do not drive: If you have been drinking alcohol. Do not ride with someone who has been drinking. When you are tired or distracted. While texting. If you have been using any mind-altering substances or drugs. Wear a helmet and other protective equipment during sports activities. If you have firearms in your house, make sure you follow all gun safety procedures. What's next? Go to your health care provider once a year for an annual wellness visit. Ask your health care provider how often you should have your eyes and teeth checked. Stay up to date on all vaccines. This information is not intended to replace advice given to you by your health care provider. Make sure you discuss any questions you have with your health care provider. Document Revised: 11/19/2020 Document Reviewed: 11/19/2020 Elsevier Patient Education  2022 [employer].

## 2021-11-03 ENCOUNTER — Encounter: Payer: Self-pay | Admitting: Family Medicine

## 2021-11-03 DIAGNOSIS — E782 Mixed hyperlipidemia: Secondary | ICD-10-CM

## 2021-11-03 DIAGNOSIS — Z8249 Family history of ischemic heart disease and other diseases of the circulatory system: Secondary | ICD-10-CM

## 2021-11-04 NOTE — Telephone Encounter (Signed)
Pt is agreeable to calcium score testing

## 2021-11-07 NOTE — Telephone Encounter (Signed)
Ordered

## 2021-12-25 ENCOUNTER — Ambulatory Visit (INDEPENDENT_AMBULATORY_CARE_PROVIDER_SITE_OTHER): Payer: Managed Care, Other (non HMO) | Admitting: Family Medicine

## 2021-12-25 VITALS — BP 126/78 | HR 82 | Temp 98.1°F | Resp 20 | Ht 70.0 in | Wt 162.0 lb

## 2021-12-25 DIAGNOSIS — R7303 Prediabetes: Secondary | ICD-10-CM

## 2021-12-25 DIAGNOSIS — E782 Mixed hyperlipidemia: Secondary | ICD-10-CM | POA: Diagnosis not present

## 2021-12-25 DIAGNOSIS — I10 Essential (primary) hypertension: Secondary | ICD-10-CM

## 2021-12-25 LAB — COMPREHENSIVE METABOLIC PANEL
ALT: 16 U/L (ref 0–53)
AST: 19 U/L (ref 0–37)
Albumin: 4.6 g/dL (ref 3.5–5.2)
Alkaline Phosphatase: 50 U/L (ref 39–117)
BUN: 16 mg/dL (ref 6–23)
CO2: 31 mEq/L (ref 19–32)
Calcium: 10.1 mg/dL (ref 8.4–10.5)
Chloride: 102 mEq/L (ref 96–112)
Creatinine, Ser: 1.1 mg/dL (ref 0.40–1.50)
GFR: 73.21 mL/min (ref >60.00)
Glucose, Bld: 99 mg/dL (ref 70–99)
Potassium: 4.2 mEq/L (ref 3.5–5.1)
Sodium: 141 mEq/L (ref 135–145)
Total Bilirubin: 0.6 mg/dL (ref 0.2–1.2)
Total Protein: 7.6 g/dL (ref 6.0–8.3)

## 2021-12-25 LAB — LIPID PANEL
Cholesterol: 192 mg/dL (ref 0–200)
HDL: 49.8 mg/dL (ref >39.00)
LDL Cholesterol: 114 mg/dL — ABNORMAL HIGH (ref 0–99)
NonHDL: 142.67
Total CHOL/HDL Ratio: 4
Triglycerides: 142 mg/dL (ref 0.0–149.0)
VLDL: 28.4 mg/dL (ref 0.0–40.0)

## 2021-12-25 LAB — HEMOGLOBIN A1C: Hgb A1c MFr Bld: 5.9 % (ref 4.6–6.5)

## 2021-12-25 MED ORDER — AMLODIPINE BESYLATE 10 MG PO TABS: 10.0000 mg | ORAL_TABLET | Freq: Every day | ORAL | 2 refills | Status: DC

## 2021-12-25 MED ORDER — LOSARTAN POTASSIUM 50 MG PO TABS: 50.0000 mg | ORAL_TABLET | Freq: Every day | ORAL | 2 refills | Status: DC

## 2021-12-25 NOTE — Patient Instructions (Addendum)
Try to have healthier snacks available if stress eating.  Depending on coronary score, may need to start cholesterol med.  No change on blood pressure meds.  Take care!

## 2021-12-25 NOTE — Progress Notes (Signed)
Subjective:  Patient ID: Willie [name], male    DOB: Jun 03, 1961  Age: 61 y.o. MRN: [id number]  CC:  Chief Complaint  Patient presents with   Prediabetes   Hyperlipidemia   Hypertension    HPI [name] presents for   Hypertension: Amlodipine 10 mg daily, losartan 50 mg daily. Home readings: 120/78-82. No new med side effects.  BP Readings from Last 3 Encounters:  12/25/21 126/78  06/19/21 122/84  12/17/20 126/72   Lab Results  Component Value Date   CREATININE 1.11 06/19/2021   Prediabetes: No current meds, diet/exercise approach.  Weight up a few pounds from January. Less exercise recently with heat/air quality. Prior improvements. Some stress eating at times with work stress. Some increased urination at times, no nocturia. Does drink a lot of coffee. Making appt with optho soon for reading glass use - increased work and eye strain.  Lab Results  Component Value Date   HGBA1C 5.9 06/19/2021   Wt Readings from Last 3 Encounters:  12/25/21 162 lb (73.5 kg)  06/19/21 159 lb 3.2 oz (72.2 kg)  12/17/20 159 lb 9.6 oz (72.4 kg)   Hyperlipidemia: With family history of CAD, no current statin.  Mother with early CAD with MIs in her 30s. The 10-year ASCVD risk score (Arnett DK, et al., 2019) is: 10.6%   Values used to calculate the score:     Age: 63 years     Sex: Male     Is Non-Hispanic African American: No     Diabetic: No     Tobacco smoker: No     Systolic Blood Pressure: 126 mmHg     Is BP treated: Yes     HDL Cholesterol: 45.8 mg/dL     Total Cholesterol: 205 mg/dL Coronary calcium scoring ordered, will be performed in 4 days. Lab Results  Component Value Date   CHOL 205 (H) 06/19/2021   HDL 45.80 06/19/2021   LDLCALC 120 (H) 06/19/2020   LDLDIRECT 103.0 06/19/2021   TRIG 295.0 (H) 06/19/2021   CHOLHDL 4 06/19/2021   Lab Results  Component Value Date   ALT 22 06/19/2021   AST 18 06/19/2021   ALKPHOS 56 06/19/2021   BILITOT 0.6 06/19/2021        History Patient Active Problem List   Diagnosis Date Noted   Recurrent umbilical hernia 06/29/2011   Past Medical History:  Diagnosis Date   Allergy    seasonal   GERD (gastroesophageal reflux disease)    Hypertension    dr Merla Riches   Umbilical hernia    Past Surgical History:  Procedure Laterality Date   HERNIA REPAIR  01/2002   UMBILICAL HERNIA REPAIR  09/01/2011   Procedure: HERNIA REPAIR UMBILICAL ADULT;  Surgeon: Shelly Rubenstein, MD;  Location: [facility] OR;  Service: General;  Laterality: N/A;   Allergies  Allergen Reactions   Penicillins Hives, Nausea And Vomiting and Swelling   Prior to Admission medications   Medication Sig Start Date End Date Taking? Authorizing Provider  amLODipine (NORVASC) 10 MG tablet Take 1 tablet (10 mg total) by mouth daily. 12/17/20  Yes Shade Flood, MD  cetirizine (ZYRTEC) 10 MG tablet Take 1 tablet (10 mg total) by mouth daily. 04/07/17  Yes English, Judeth Cornfield D, PA  diphenhydrAMINE (BENADRYL) 25 MG tablet Take 25 mg by mouth as needed.   Yes [provider]  famotidine (PEPCID) 20 MG tablet Take 20 mg by mouth as needed for heartburn or indigestion.  Yes [provider]  losartan (COZAAR) 50 MG tablet Take 1 tablet (50 mg total) by mouth daily. 12/17/20  Yes Wendie Agreste, MD  Multiple Vitamins-Minerals (CENTRUM SILVER 50+MEN PO) Take by mouth daily.   Yes [provider]   Social History   Socioeconomic History   Marital status: Married    Spouse name: Not on file   Number of children: Not on file   Years of education: Not on file   Highest education level: Not on file  Occupational History   Not on file  Tobacco Use   Smoking status: Never   Smokeless tobacco: Never  Substance and Sexual Activity   Alcohol use: No   Drug use: No   Sexual activity: Yes  Other Topics Concern   Not on file  Social History Narrative   Not on file   Social Determinants of Health   Financial Resource  Strain: Not on file  Food Insecurity: Not on file  Transportation Needs: Not on file  Physical Activity: Not on file  Stress: Not on file  Social Connections: Not on file  Intimate Partner Violence: Not on file    Review of Systems  Constitutional:  Negative for fatigue and unexpected weight change.  Eyes:  Negative for visual disturbance.  Respiratory:  Negative for cough, chest tightness and shortness of breath.   Cardiovascular:  Negative for chest pain, palpitations and leg swelling.  Gastrointestinal:  Negative for abdominal pain and blood in stool.  Neurological:  Negative for dizziness, light-headedness and headaches.     Objective:   Vitals:   12/25/21 0835  BP: 126/78  Pulse: 82  Resp: 20  Temp: 98.1 F (36.7 C)  TempSrc: Oral  SpO2: 100%  Weight: 162 lb (73.5 kg)  Height: 5\' 10"  (1.778 m)     Physical Exam Vitals reviewed.  Constitutional:      Appearance: He is well-developed.  HENT:     Head: Normocephalic and atraumatic.  Neck:     Vascular: No carotid bruit or JVD.  Cardiovascular:     Rate and Rhythm: Normal rate and regular rhythm.     Heart sounds: Normal heart sounds. No murmur heard. Pulmonary:     Effort: Pulmonary effort is normal.     Breath sounds: Normal breath sounds. No rales.  Musculoskeletal:     Right lower leg: No edema.     Left lower leg: No edema.  Skin:    General: Skin is warm and dry.  Neurological:     Mental Status: He is alert and oriented to person, place, and time.  Psychiatric:        Mood and Affect: Mood normal.        Assessment & Plan:  Willie [name] is a 61 y.o. male . Mixed hyperlipidemia - Plan: Lipid panel  -With family history of early coronary disease in mother.  Coronary calcium scoring test pending in the next few days.  Discussed limitations of that test with family history of cardiac disease.  Updated lipids ordered.  Depending on those 2 results likely will recommend statin but can wait on  results first.  Potential risks versus benefits of statins were discussed  Essential hypertension - Plan: amLODipine (NORVASC) 10 MG tablet, losartan (COZAAR) 50 MG tablet, Comprehensive metabolic panel  -Stable on current regimen, continue losartan, amlodipine same doses, labs pending  Prediabetes - Plan: Hemoglobin A1c  -Healthier snacks discussed, check updated A1c.  Restart exercise as able.  71-month  follow-up.  Meds ordered this encounter  Medications   amLODipine (NORVASC) 10 MG tablet    Sig: Take 1 tablet (10 mg total) by mouth daily.    Dispense:  90 tablet    Refill:  2   losartan (COZAAR) 50 MG tablet    Sig: Take 1 tablet (50 mg total) by mouth daily.    Dispense:  90 tablet    Refill:  2   Patient Instructions  Try to have healthier snacks available if stress eating.  Depending on coronary score, may need to start cholesterol med.  No change on blood pressure meds.  Take care!    Signed,   Meredith Staggers, MD [facility], [facility] 12/25/21 9:07 AM

## 2021-12-29 ENCOUNTER — Ambulatory Visit
Admission: RE | Admit: 2021-12-29 | Discharge: 2021-12-29 | Disposition: A | Discharge status: Home / Self Care | Payer: No Typology Code available for payment source | Source: Ambulatory Visit | Attending: Family Medicine | Admitting: Family Medicine

## 2021-12-29 DIAGNOSIS — Z8249 Family history of ischemic heart disease and other diseases of the circulatory system: Secondary | ICD-10-CM

## 2021-12-29 DIAGNOSIS — E782 Mixed hyperlipidemia: Secondary | ICD-10-CM

## 2022-01-05 ENCOUNTER — Encounter: Payer: Self-pay | Admitting: Family Medicine

## 2022-01-05 DIAGNOSIS — E782 Mixed hyperlipidemia: Secondary | ICD-10-CM

## 2022-01-05 DIAGNOSIS — R931 Abnormal findings on diagnostic imaging of heart and coronary circulation: Secondary | ICD-10-CM

## 2022-01-06 MED ORDER — ROSUVASTATIN CALCIUM 10 MG PO TABS: 10.0000 mg | ORAL_TABLET | Freq: Every day | ORAL | 3 refills | Status: DC

## 2022-01-25 ENCOUNTER — Encounter: Payer: Self-pay | Admitting: Family Medicine

## 2022-05-22 IMAGING — XA DG FLUORO GUIDE NDL PLC/BX
1 series · 1 of 1 positions shown · non-contrast
Comparison: none

CLINICAL DATA: Left shoulder pain.

[Series 1: ortho standard · 1 of 1 slices shown]
[im 1/1]
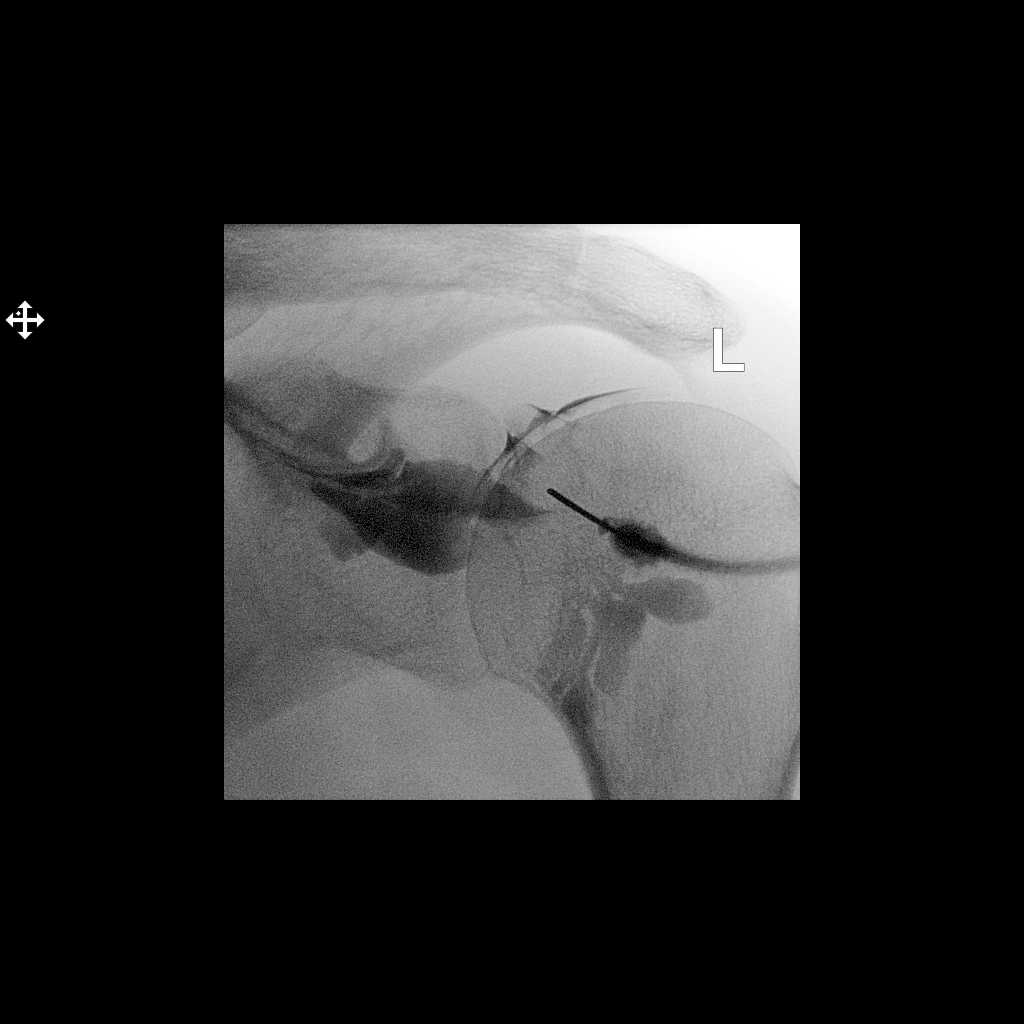

[1 of 1 positions shown; findings below may reference images not displayed]

FLUOROSCOPY TIME:  Radiation Exposure Index (as provided by the
fluoroscopic device): 3.67 uGy*m2

PROCEDURE:
Left SHOULDER INJECTION UNDER FLUOROSCOPY

Injection was performed by Jhony Depriest, PA with direct
supervision.

An appropriate skin entrance site was determined. The site was
marked, prepped with Betadine, draped in the usual sterile fashion,
and infiltrated locally with Lidocaine. A 22 gauge spinal needle was
advanced to the superomedial margin of the humeral head under
intermittent fluoroscopy. 1 ml of Lidocaine injected easily. 1-2 ml
of Isovue M 200 was used to confirm placement in the left shoulder
capsule. I then injected 120mg of Depo-Medrol and 1.5 ml of 0.5%
Bupivicaine. No immediate complication.
IMPRESSION: Technically successful left shoulder steroid injection.

## 2022-06-02 ENCOUNTER — Ambulatory Visit
Admission: EM | Admit: 2022-06-02 | Discharge: 2022-06-02 | Disposition: A | Discharge status: Home / Self Care | Payer: Managed Care, Other (non HMO) | Attending: Physician Assistant | Admitting: Physician Assistant

## 2022-06-02 DIAGNOSIS — J209 Acute bronchitis, unspecified: Secondary | ICD-10-CM

## 2022-06-02 HISTORY — DX: Hyperlipidemia, unspecified: E78.5

## 2022-06-02 MED ORDER — AZITHROMYCIN 250 MG PO TABS: 250.0000 mg | ORAL_TABLET | Freq: Every day | ORAL | 0 refills | Status: DC

## 2022-06-02 MED ORDER — PREDNISONE 20 MG PO TABS: 40.0000 mg | ORAL_TABLET | Freq: Every day | ORAL | 0 refills | Status: AC

## 2022-06-02 NOTE — ED Triage Notes (Signed)
Pt c/o sore throat onset ~ Friday that transformed into congestion in back of the head and cough. Was seen via telehealth dx w/ bronchitis and given tessalon and albuterol without relief. Now has intermittent fever and night sweats. Denies pain in triage.

## 2022-06-02 NOTE — ED Provider Notes (Signed)
[facility]    CSN: [id number] Arrival date & time: 06/02/22  6387      History   Chief Complaint Chief Complaint  Patient presents with   Cough    HPI Willie [name] is a 61 y.o. male.   Patient here today for evaluation of cough, congestion that has been ongoing for almost one week. He had telehealth visit where he was diagnosed with bronchitis and was prescribed tessalon pearles and inhaler which has not been significantly helpful. Cough is typically not productive. He has had low grade fever with Tmax 100.4 F. He denies any sore throat but states he did have sore throat initially with symptom onset.   The history is provided by the patient.  Cough Associated symptoms: fever   Associated symptoms: no eye discharge, no shortness of breath and no sore throat     Past Medical History:  Diagnosis Date   Allergy    seasonal   Dyslipidemia    GERD (gastroesophageal reflux disease)    Hypertension    dr Merla Riches   Umbilical hernia     Patient Active Problem List   Diagnosis Date Noted   Recurrent umbilical hernia 06/29/2011    Past Surgical History:  Procedure Laterality Date   HERNIA REPAIR  01/2002   UMBILICAL HERNIA REPAIR  09/01/2011   Procedure: HERNIA REPAIR UMBILICAL ADULT;  Surgeon: Shelly Rubenstein, MD;  Location: [facility] OR;  Service: General;  Laterality: N/A;       Home Medications    Prior to Admission medications   Medication Sig Start Date End Date Taking? Authorizing Provider  azithromycin (ZITHROMAX) 250 MG tablet Take 1 tablet (250 mg total) by mouth daily. Take first 2 tablets together, then 1 every day until finished. 06/02/22  Yes Tomi Bamberger, PA-C  predniSONE (DELTASONE) 20 MG tablet Take 2 tablets (40 mg total) by mouth daily with breakfast for 5 days. 06/02/22 06/07/22 Yes Tomi Bamberger, PA-C  amLODipine (NORVASC) 10 MG tablet Take 1 tablet (10 mg total) by mouth daily. 12/25/21   Shade Flood, MD  cetirizine (ZYRTEC)  10 MG tablet Take 1 tablet (10 mg total) by mouth daily. 04/07/17   Trena Platt D, PA  diphenhydrAMINE (BENADRYL) 25 MG tablet Take 25 mg by mouth as needed.    [provider]  famotidine (PEPCID) 20 MG tablet Take 20 mg by mouth as needed for heartburn or indigestion.    [provider]  losartan (COZAAR) 50 MG tablet Take 1 tablet (50 mg total) by mouth daily. 12/25/21   Shade Flood, MD  Multiple Vitamins-Minerals (CENTRUM SILVER 50+MEN PO) Take by mouth daily.    [provider]  rosuvastatin (CRESTOR) 10 MG tablet Take 1 tablet (10 mg total) by mouth daily. 01/06/22   Shade Flood, MD    Family History Family History  Problem Relation Age of Onset   Cancer Father        lung   Pneumonia Father    Lung cancer Father    Hypertension Mother    Heart disease Mother    Seizures Mother    Healthy Sister    Healthy Brother    Cancer Maternal Grandmother    Heart disease Paternal Grandfather    Healthy Brother     Social History Social History   Tobacco Use   Smoking status: Never   Smokeless tobacco: Never  Substance Use Topics   Alcohol use: No   Drug use:  No     Allergies   Penicillins   Review of Systems Review of Systems  Constitutional:  Positive for fever.  HENT:  Positive for congestion. Negative for sore throat.   Eyes:  Negative for discharge and redness.  Respiratory:  Positive for cough. Negative for shortness of breath.   Gastrointestinal:  Negative for diarrhea, nausea and vomiting.     Physical Exam Triage Vital Signs ED Triage Vitals  Enc Vitals Group     BP 06/02/22 1013 132/89     Pulse Rate 06/02/22 1013 98     Resp 06/02/22 1013 16     Temp 06/02/22 1013 98 F (36.7 C)     Temp Source 06/02/22 1013 Oral     SpO2 06/02/22 1013 98 %     Weight --      Height --      Head Circumference --      Peak Flow --      Pain Score 06/02/22 1014 0     Pain Loc --      Pain Edu? --      Excl. in [place]? --     No data found.  Updated Vital Signs BP 132/89 (BP Location: Right Arm)   Pulse 98   Temp 98 F (36.7 C) (Oral)   Resp 16   SpO2 98%     Physical Exam Vitals and nursing note reviewed.  Constitutional:      General: He is not in acute distress.    Appearance: Normal appearance. He is not ill-appearing.  HENT:     Head: Normocephalic and atraumatic.     Nose: Nose normal. No congestion.     Mouth/Throat:     Mouth: Mucous membranes are moist.     Pharynx: Oropharynx is clear. No oropharyngeal exudate or posterior oropharyngeal erythema.  Eyes:     Conjunctiva/sclera: Conjunctivae normal.  Cardiovascular:     Rate and Rhythm: Normal rate and regular rhythm.  Pulmonary:     Effort: Pulmonary effort is normal. No respiratory distress.     Breath sounds: Normal breath sounds. No wheezing, rhonchi or rales.  Neurological:     Mental Status: He is alert.      UC Treatments / Results  Labs (all labs ordered are listed, but only abnormal results are displayed) Labs Reviewed - No data to display  EKG   Radiology No results found.  Procedures Procedures (including critical care time)  Medications Ordered in UC Medications - No data to display  Initial Impression / Assessment and Plan / UC Course  I have reviewed the triage vital signs and the nursing notes.  Pertinent labs & imaging results that were available during my care of the patient were reviewed by me and considered in my medical decision making (see chart for details).    Steroid burst and Z-Pak prescribed for treatment of suspected bronchitis but also for coverage of atypical pneumonia.  Encouraged follow-up if no gradual improvement or with any further concerns as he may need chest x-ray if symptoms persist.  Patient expresses understanding.  He will continue using Tessalon Perles and inhaler if needed.  Final Clinical Impressions(s) / UC Diagnoses   Final diagnoses:  Acute bronchitis, unspecified  organism   Discharge Instructions   None    ED Prescriptions     Medication Sig Dispense Auth. Provider   predniSONE (DELTASONE) 20 MG tablet Take 2 tablets (40 mg total) by mouth daily with breakfast for 5 days.  10 tablet Erma Pinto F, PA-C   azithromycin (ZITHROMAX) 250 MG tablet Take 1 tablet (250 mg total) by mouth daily. Take first 2 tablets together, then 1 every day until finished. 6 tablet Tomi Bamberger, PA-C      PDMP not reviewed this encounter.   Tomi Bamberger, PA-C 06/02/22 1151

## 2022-06-11 ENCOUNTER — Ambulatory Visit
Admission: EM | Admit: 2022-06-11 | Discharge: 2022-06-11 | Disposition: A | Discharge status: Home / Self Care | Payer: Managed Care, Other (non HMO) | Attending: Nurse Practitioner | Admitting: Nurse Practitioner

## 2022-06-11 DIAGNOSIS — J209 Acute bronchitis, unspecified: Secondary | ICD-10-CM | POA: Diagnosis not present

## 2022-06-11 MED ORDER — DOXYCYCLINE HYCLATE 100 MG PO CAPS: 100.0000 mg | ORAL_CAPSULE | Freq: Two times a day (BID) | ORAL | 0 refills | Status: DC

## 2022-06-11 MED ORDER — BENZONATATE 200 MG PO CAPS: 200.0000 mg | ORAL_CAPSULE | Freq: Three times a day (TID) | ORAL | 0 refills | Status: DC | PRN

## 2022-06-11 NOTE — Discharge Instructions (Signed)
Tessalon as needed for cough Doxycycline twice daily for 10 days Rest and fluids Please go to the ER if you have any worsening symptoms Please follow-up with your PCP in 2 to 3 days for recheck

## 2022-06-11 NOTE — ED Provider Notes (Signed)
[facility]    CSN: [id number] Arrival date & time: 06/11/22  1438      History   Chief Complaint Chief Complaint  Patient presents with   URI    HPI Willie [name] is a 62 y.o. male who presents for evaluation of URI symptoms for 15 days. Patient reports associated symptoms of productive cough with purulent phlegm, fatigue, congestion. Denies N/V/D, fevers, sore throat, ear pain, body aches, shortness of breath. Patient does not have a hx of asthma or smoking. No known sick contacts and no recent travel. Pt is vaccinated for COVID. Pt is not vaccinated for flu this season.  She was seen in urgent care on 12/27 for 5 days of cough.  He was prescribed prednisone and Zithromax.  He reports minimal improvement of the Zithromax.  Pt has taken over-the-counter cough medicine OTC for symptoms. Pt has no other concerns at this time.    URI Presenting symptoms: congestion, cough and fatigue     Past Medical History:  Diagnosis Date   Allergy    seasonal   Dyslipidemia    GERD (gastroesophageal reflux disease)    Hypertension    dr Laney Pastor   Umbilical hernia     Patient Active Problem List   Diagnosis Date Noted   Recurrent umbilical hernia 29/92/4268    Past Surgical History:  Procedure Laterality Date   HERNIA REPAIR  08/4194   UMBILICAL HERNIA REPAIR  09/01/2011   Procedure: HERNIA REPAIR UMBILICAL ADULT;  Surgeon: Harl Bowie, MD;  Location: [place];  Service: General;  Laterality: N/A;       Home Medications    Prior to Admission medications   Medication Sig Start Date End Date Taking? Authorizing Provider  benzonatate (TESSALON) 200 MG capsule Take 1 capsule (200 mg total) by mouth 3 (three) times daily as needed for cough. 06/11/22  Yes Melynda Ripple, NP  doxycycline (VIBRAMYCIN) 100 MG capsule Take 1 capsule (100 mg total) by mouth 2 (two) times daily. 06/11/22  Yes Melynda Ripple, NP  amLODipine (NORVASC) 10 MG tablet Take 1 tablet (10 mg total) by  mouth daily. 12/25/21   Wendie Agreste, MD  cetirizine (ZYRTEC) 10 MG tablet Take 1 tablet (10 mg total) by mouth daily. 04/07/17   Ivar Drape D, PA  diphenhydrAMINE (BENADRYL) 25 MG tablet Take 25 mg by mouth as needed.    [provider]  famotidine (PEPCID) 20 MG tablet Take 20 mg by mouth as needed for heartburn or indigestion.    [provider]  losartan (COZAAR) 50 MG tablet Take 1 tablet (50 mg total) by mouth daily. 12/25/21   Wendie Agreste, MD  Multiple Vitamins-Minerals (CENTRUM SILVER 50+MEN PO) Take by mouth daily.    [provider]  rosuvastatin (CRESTOR) 10 MG tablet Take 1 tablet (10 mg total) by mouth daily. 01/06/22   Wendie Agreste, MD    Family History Family History  Problem Relation Age of Onset   Cancer Father        lung   Pneumonia Father    Lung cancer Father    Hypertension Mother    Heart disease Mother    Seizures Mother    Healthy Sister    Healthy Brother    Cancer Maternal Grandmother    Heart disease Paternal Grandfather    Healthy Brother     Social History Social History   Tobacco Use   Smoking status: Never   Smokeless  tobacco: Never  Substance Use Topics   Alcohol use: No   Drug use: No     Allergies   Penicillins   Review of Systems Review of Systems  Constitutional:  Positive for fatigue.  HENT:  Positive for congestion.   Respiratory:  Positive for cough.      Physical Exam Triage Vital Signs ED Triage Vitals  Enc Vitals Group     BP 06/11/22 1617 (!) 144/96     Pulse Rate 06/11/22 1617 73     Resp 06/11/22 1617 19     Temp 06/11/22 1617 97.7 F (36.5 C)     Temp src --      SpO2 06/11/22 1617 98 %     Weight --      Height --      Head Circumference --      Peak Flow --      Pain Score 06/11/22 1616 0     Pain Loc --      Pain Edu? --      Excl. in [place]? --    No data found.  Updated Vital Signs BP (!) 144/96   Pulse 73   Temp 97.7 F (36.5 C)   Resp 19   SpO2  98%   Visual Acuity Right Eye Distance:   Left Eye Distance:   Bilateral Distance:    Right Eye Near:   Left Eye Near:    Bilateral Near:     Physical Exam Vitals and nursing note reviewed.  Constitutional:      General: He is not in acute distress.    Appearance: Normal appearance. He is not ill-appearing or toxic-appearing.  HENT:     Head: Normocephalic and atraumatic.     Right Ear: Tympanic membrane and ear canal normal.     Left Ear: Tympanic membrane and ear canal normal.     Nose: Congestion present.     Mouth/Throat:     Mouth: Mucous membranes are moist.     Pharynx: No oropharyngeal exudate or posterior oropharyngeal erythema.  Eyes:     Pupils: Pupils are equal, round, and reactive to light.  Cardiovascular:     Rate and Rhythm: Normal rate and regular rhythm.     Heart sounds: Normal heart sounds.  Pulmonary:     Effort: Pulmonary effort is normal.     Breath sounds: Normal breath sounds.  Musculoskeletal:     Cervical back: Normal range of motion and neck supple.  Lymphadenopathy:     Cervical: No cervical adenopathy.  Skin:    General: Skin is warm and dry.  Neurological:     General: No focal deficit present.     Mental Status: He is alert and oriented to person, place, and time.  Psychiatric:        Mood and Affect: Mood normal.        Behavior: Behavior normal.      UC Treatments / Results  Labs (all labs ordered are listed, but only abnormal results are displayed) Labs Reviewed - No data to display  EKG   Radiology No results found.  Procedures Procedures (including critical care time)  Medications Ordered in UC Medications - No data to display  Initial Impression / Assessment and Plan / UC Course  I have reviewed the triage vital signs and the nursing notes.  Pertinent labs & imaging results that were available during my care of the patient were reviewed by me and considered in my medical  decision making (see chart for  details).     Reviewed exam and symptoms with patient.  No red flags on exam. Discussed likely ineffective treatment with antibiotics as it was a viral process at that point.  Given symptoms we will start doxycycline Tessalon as needed cough Rest and fluids PCP follow-up 2 to 3 days for recheck ER precautions reviewed and patient verbalized understanding Final Clinical Impressions(s) / UC Diagnoses   Final diagnoses:  Acute bronchitis, unspecified organism     Discharge Instructions      Tessalon as needed for cough Doxycycline twice daily for 10 days Rest and fluids Please go to the ER if you have any worsening symptoms Please follow-up with your PCP in 2 to 3 days for recheck   ED Prescriptions     Medication Sig Dispense Auth. Provider   benzonatate (TESSALON) 200 MG capsule Take 1 capsule (200 mg total) by mouth 3 (three) times daily as needed for cough. 20 capsule Radford Pax, NP   doxycycline (VIBRAMYCIN) 100 MG capsule Take 1 capsule (100 mg total) by mouth 2 (two) times daily. 20 capsule Radford Pax, NP      PDMP not reviewed this encounter.   Radford Pax, NP 06/11/22 [phone]

## 2022-06-11 NOTE — ED Triage Notes (Signed)
Pt presents to [facility] with co of cough and congestion since 12/22 pt reports he was treated for bronchitis on 12/27 but symptoms continue. Pt reports safetussin otc.

## 2022-06-14 ENCOUNTER — Telehealth: Payer: Self-pay | Admitting: Internal Medicine

## 2022-06-14 ENCOUNTER — Encounter: Payer: Self-pay | Admitting: Internal Medicine

## 2022-06-14 ENCOUNTER — Ambulatory Visit: Payer: Managed Care, Other (non HMO) | Attending: Internal Medicine | Admitting: Internal Medicine

## 2022-06-14 VITALS — BP 126/82 | HR 78 | Ht 70.0 in | Wt 161.8 lb

## 2022-06-14 DIAGNOSIS — E7801 Familial hypercholesterolemia: Secondary | ICD-10-CM | POA: Diagnosis not present

## 2022-06-14 DIAGNOSIS — R931 Abnormal findings on diagnostic imaging of heart and coronary circulation: Secondary | ICD-10-CM | POA: Diagnosis not present

## 2022-06-14 DIAGNOSIS — I10 Essential (primary) hypertension: Secondary | ICD-10-CM

## 2022-06-14 NOTE — Patient Instructions (Signed)
Medication Instructions:  NO CHANGES today   *If you need a refill on your cardiac medications before your next appointment, please call your pharmacy*   Lab Work: NMR lipoprofile and LPa today  FASTING lab work before next visit  If you have labs (blood work) drawn today and your tests are completely normal, you will receive your results only by: [place] (if you have MyChart) OR A paper copy in the mail If you have any lab test that is abnormal or we need to change your treatment, we will call you to review the results.   Testing/Procedures: Genetic Test -- results available via email in 2-3 weeks Dr. Debara Pickett will reach out via MyChart to explain results   Follow-Up: At [facility], you and your health needs are our priority.  As part of our continuing mission to provide you with exceptional heart care, we have created designated Provider Care Teams.  These Care Teams include your primary Cardiologist (physician) and Advanced Practice Providers (APPs -  Physician Assistants and Nurse Practitioners) who all work together to provide you with the care you need, when you need it.  We recommend signing up for the patient portal called "MyChart".  Sign up information is provided on this After Visit Summary.  MyChart is used to connect with patients for Virtual Visits (Telemedicine).  Patients are able to view lab/test results, encounter notes, upcoming appointments, etc.  Non-urgent messages can be sent to your provider as well.   To learn more about what you can do with MyChart, go to [email or web address].    Your next appointment:    3-4 months with Dr. Debara Pickett
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
## 2022-06-14 NOTE — Telephone Encounter (Signed)
Genetic test for dyslipidemia/ASCVD ordered ([facility]ight) Cheek swab completed in office Specimen and necessary paperwork mailed. ID: [id number]

## 2022-06-16 LAB — NMR, LIPOPROFILE
Cholesterol, Total: 127 mg/dL (ref 100–199)
HDL Particle Number: 32.1 umol/L (ref >=30.5)
HDL-C: 42 mg/dL (ref >39)
LDL Particle Number: 915 nmol/L (ref <1000)
LDL Size: 20.3 nm — ABNORMAL LOW (ref >20.5)
LDL-C (NIH Calc): 60 mg/dL (ref 0–99)
LP-IR Score: 60 — ABNORMAL HIGH (ref <=45)
Small LDL Particle Number: 507 nmol/L (ref <=527)
Triglycerides: 141 mg/dL (ref 0–149)

## 2022-06-16 LAB — LIPOPROTEIN A (LPA): Lipoprotein (a): 32.3 nmol/L (ref <75.0)

## 2022-06-21 ENCOUNTER — Encounter: Payer: Self-pay | Admitting: Family Medicine

## 2022-06-21 ENCOUNTER — Ambulatory Visit (INDEPENDENT_AMBULATORY_CARE_PROVIDER_SITE_OTHER): Payer: Managed Care, Other (non HMO) | Admitting: Family Medicine

## 2022-06-21 VITALS — BP 122/76 | HR 80 | Temp 98.4°F | Ht 69.75 in | Wt 162.4 lb

## 2022-06-21 DIAGNOSIS — E782 Mixed hyperlipidemia: Secondary | ICD-10-CM

## 2022-06-21 DIAGNOSIS — J40 Bronchitis, not specified as acute or chronic: Secondary | ICD-10-CM

## 2022-06-21 DIAGNOSIS — R931 Abnormal findings on diagnostic imaging of heart and coronary circulation: Secondary | ICD-10-CM | POA: Diagnosis not present

## 2022-06-21 DIAGNOSIS — I10 Essential (primary) hypertension: Secondary | ICD-10-CM | POA: Diagnosis not present

## 2022-06-21 DIAGNOSIS — R7303 Prediabetes: Secondary | ICD-10-CM | POA: Diagnosis not present

## 2022-06-21 DIAGNOSIS — Z23 Encounter for immunization: Secondary | ICD-10-CM

## 2022-06-21 LAB — COMPREHENSIVE METABOLIC PANEL
ALT: 33 U/L (ref 0–53)
AST: 27 U/L (ref 0–37)
Albumin: 4.1 g/dL (ref 3.5–5.2)
Alkaline Phosphatase: 57 U/L (ref 39–117)
BUN: 16 mg/dL (ref 6–23)
CO2: 29 mEq/L (ref 19–32)
Calcium: 9.2 mg/dL (ref 8.4–10.5)
Chloride: 107 mEq/L (ref 96–112)
Creatinine, Ser: 1.2 mg/dL (ref 0.40–1.50)
GFR: 65.73 mL/min (ref >60.00)
Glucose, Bld: 107 mg/dL — ABNORMAL HIGH (ref 70–99)
Potassium: 4.7 mEq/L (ref 3.5–5.1)
Sodium: 143 mEq/L (ref 135–145)
Total Bilirubin: 0.5 mg/dL (ref 0.2–1.2)
Total Protein: 6.9 g/dL (ref 6.0–8.3)

## 2022-06-21 LAB — HEMOGLOBIN A1C: Hgb A1c MFr Bld: 6.3 % (ref 4.6–6.5)

## 2022-06-21 MED ORDER — ROSUVASTATIN CALCIUM 10 MG PO TABS: 10.0000 mg | ORAL_TABLET | Freq: Every day | ORAL | 3 refills | Status: DC

## 2022-06-21 MED ORDER — LOSARTAN POTASSIUM 50 MG PO TABS: 50.0000 mg | ORAL_TABLET | Freq: Every day | ORAL | 2 refills | Status: DC

## 2022-06-21 MED ORDER — AMLODIPINE BESYLATE 10 MG PO TABS: 10.0000 mg | ORAL_TABLET | Freq: Every day | ORAL | 2 refills | Status: DC

## 2022-06-21 NOTE — Progress Notes (Signed)
Subjective:  Patient ID: Willie [name], male    DOB: 14-Mar-1961  Age: 62 y.o. MRN: [id number]  CC:  Chief Complaint  Patient presents with   Bronchitis   Prediabetes   Hyperlipidemia   Hypertension    HPI Willie [name] presents for   Cough/bronchitis Urgent care note reviewed from 10 days ago, treated with Tessalon and doxycycline for bronchitis.  Previously treated 1227 with prednisone, azithromycin. Cough is improving. Only occasional cough, no fever or dyspnea.   Hx of left adhesive capsulitis, treated by ortho. S/p PT.  Holding on surgery due to time of rehab, and nondominant arm.   Prediabetes: Diet/exercise approach. Exercise - 2-3 days/week, 30 min.  Rare sugar beverages, fast food.   Lab Results  Component Value Date   HGBA1C 5.9 12/25/2021   Wt Readings from Last 3 Encounters:  06/21/22 162 lb 6.4 oz (73.7 kg)  06/14/22 161 lb 12.8 oz (73.4 kg)  12/25/21 162 lb (73.5 kg)   Hyperlipidemia: Crestor 10 mg daily.  Recent visit with lipid specialist, Dr. Rennis Golden.  Family history of heart disease, significant elevated coronary calcium score previously.  NMR LipoProfile ordered and lipoprotein a.  LDL goal less than 70 -he had a reading of 60.  LPA was negative.  Continued on same therapy. No new side effects.  Lab Results  Component Value Date   CHOL 192 12/25/2021   HDL 49.80 12/25/2021   LDLCALC 114 (H) 12/25/2021   LDLDIRECT 103.0 06/19/2021   TRIG 142.0 12/25/2021   CHOLHDL 4 12/25/2021   Lab Results  Component Value Date   ALT 16 12/25/2021   AST 19 12/25/2021   ALKPHOS 50 12/25/2021   BILITOT 0.6 12/25/2021   Hypertension: Amlodipine 10 mg daily, losartan 50 mg qd. Home readings: 120/70-80's.  BP Readings from Last 3 Encounters:  06/21/22 122/76  06/14/22 126/82  06/11/22 (!) 144/96   Lab Results  Component Value Date   CREATININE 1.10 12/25/2021   Health maintenance: Flu vaccine: declines  Shingles vaccine: agrees to vaccine - nurse visit on  Friday.    History Patient Active Problem List   Diagnosis Date Noted   Recurrent umbilical hernia 06/29/2011   Past Medical History:  Diagnosis Date   Allergy    seasonal   Dyslipidemia    GERD (gastroesophageal reflux disease)    Hypertension    dr Merla Riches   Umbilical hernia    Past Surgical History:  Procedure Laterality Date   HERNIA REPAIR  01/2002   UMBILICAL HERNIA REPAIR  09/01/2011   Procedure: HERNIA REPAIR UMBILICAL ADULT;  Surgeon: Shelly Rubenstein, MD;  Location: [facility];  Service: General;  Laterality: N/A;   Allergies  Allergen Reactions   Penicillins Hives, Nausea And Vomiting and Swelling   Prior to Admission medications   Medication Sig Start Date End Date Taking? Authorizing Provider  albuterol (VENTOLIN HFA) 108 (90 Base) MCG/ACT inhaler Inhale 2 puffs into the lungs every 6 (six) hours as needed for shortness of breath or wheezing. 06/01/22   [provider]  amLODipine (NORVASC) 10 MG tablet Take 1 tablet (10 mg total) by mouth daily. 12/25/21   Shade Flood, MD  benzonatate (TESSALON) 200 MG capsule Take 1 capsule (200 mg total) by mouth 3 (three) times daily as needed for cough. 06/11/22   Radford Pax, NP  cetirizine (ZYRTEC) 10 MG tablet Take 1 tablet (10 mg total) by mouth daily. 04/07/17   Trena Platt D, PA  diphenhydrAMINE (  BENADRYL) 25 MG tablet Take 25 mg by mouth as needed.    [provider]  doxycycline (VIBRAMYCIN) 100 MG capsule Take 1 capsule (100 mg total) by mouth 2 (two) times daily. 06/11/22   Melynda Ripple, NP  famotidine (PEPCID) 20 MG tablet Take 20 mg by mouth as needed for heartburn or indigestion.    [provider]  losartan (COZAAR) 50 MG tablet Take 1 tablet (50 mg total) by mouth daily. 12/25/21   Wendie Agreste, MD  Multiple Vitamins-Minerals (CENTRUM SILVER 50+MEN PO) Take by mouth daily.    [provider]  rosuvastatin (CRESTOR) 10 MG tablet Take 1 tablet (10 mg total) by mouth  daily. 01/06/22   Wendie Agreste, MD   Social History   Socioeconomic History   Marital status: Married    Spouse name: Not on file   Number of children: Not on file   Years of education: Not on file   Highest education level: Not on file  Occupational History   Not on file  Tobacco Use   Smoking status: Never   Smokeless tobacco: Never  Substance and Sexual Activity   Alcohol use: No   Drug use: No   Sexual activity: Yes  Other Topics Concern   Not on file  Social History Narrative   Not on file   Social Determinants of Health   Financial Resource Strain: Not on file  Food Insecurity: Not on file  Transportation Needs: Not on file  Physical Activity: Not on file  Stress: Not on file  Social Connections: Not on file  Intimate Partner Violence: Not on file    Review of Systems  Constitutional:  Negative for fatigue and unexpected weight change.  Eyes:  Negative for visual disturbance.  Respiratory:  Negative for cough, chest tightness and shortness of breath.   Cardiovascular:  Negative for chest pain, palpitations and leg swelling.  Gastrointestinal:  Negative for abdominal pain and blood in stool.  Neurological:  Negative for dizziness, light-headedness and headaches.     Objective:   Vitals:   06/21/22 0823  BP: 122/76  Pulse: 80  Temp: 98.4 F (36.9 C)  TempSrc: Oral  SpO2: 98%  Weight: 162 lb 6.4 oz (73.7 kg)  Height: 5' 9.75" (1.772 m)     Physical Exam Vitals reviewed.  Constitutional:      Appearance: He is well-developed.  HENT:     Head: Normocephalic and atraumatic.  Neck:     Vascular: No carotid bruit or JVD.  Cardiovascular:     Rate and Rhythm: Normal rate and regular rhythm.     Heart sounds: Normal heart sounds. No murmur heard. Pulmonary:     Effort: Pulmonary effort is normal.     Breath sounds: Normal breath sounds. No rales.  Musculoskeletal:     Right lower leg: No edema.     Left lower leg: No edema.  Skin:     General: Skin is warm and dry.  Neurological:     Mental Status: He is alert and oriented to person, place, and time.  Psychiatric:        Mood and Affect: Mood normal.        Assessment & Plan:  Willie [name] is a 62 y.o. male . Essential hypertension - Plan: Comprehensive metabolic panel, amLODipine (NORVASC) 10 MG tablet, losartan (COZAAR) 50 MG tablet  -  Stable, tolerating current regimen. Medications refilled. Labs pending as above.   Mixed hyperlipidemia - Plan:  rosuvastatin (CRESTOR) 10 MG tablet Elevated coronary artery calcium score - Plan: rosuvastatin (CRESTOR) 10 MG tablet  -With family history of coronary disease.  Lipid specialist evaluation as above with recent reassuring levels on NMR LipoProfile and LpA.  Continue Crestor same dose.  Bronchitis  -Improved, RTC precautions if recurrent.  Prediabetes - Plan: Comprehensive metabolic panel, Hemoglobin A1c  -Continue exercise, goal of 150 minutes/week discussed.  Continue to watch diet.  Check labs  Need for shingles vaccine - Plan: Varicella-zoster vaccine IM  -Initial vaccine this Friday at nurse visit, potential side effects discussed.  Repeat in 6 months  Meds ordered this encounter  Medications   amLODipine (NORVASC) 10 MG tablet    Sig: Take 1 tablet (10 mg total) by mouth daily.    Dispense:  90 tablet    Refill:  2   losartan (COZAAR) 50 MG tablet    Sig: Take 1 tablet (50 mg total) by mouth daily.    Dispense:  90 tablet    Refill:  2   rosuvastatin (CRESTOR) 10 MG tablet    Sig: Take 1 tablet (10 mg total) by mouth daily.    Dispense:  90 tablet    Refill:  3   Patient Instructions  Nurse visit for 1st shingles vaccine this Friday. Repeat in 2-6 months.  No med changes at this time.  Glad cough has improved. Take care!      Signed,   Merri Ray, MD [place], [place] [place] [facility] 06/21/22 8:39 AM

## 2022-06-21 NOTE — Patient Instructions (Signed)
Nurse visit for 1st shingles vaccine this Friday. Repeat in 2-6 months.  No med changes at this time.  Glad cough has improved. Take care!

## 2022-06-25 ENCOUNTER — Ambulatory Visit (INDEPENDENT_AMBULATORY_CARE_PROVIDER_SITE_OTHER): Payer: Managed Care, Other (non HMO)

## 2022-06-25 DIAGNOSIS — Z23 Encounter for immunization: Secondary | ICD-10-CM | POA: Diagnosis not present

## 2022-06-25 NOTE — Progress Notes (Signed)
Pt came in for his first Shingles vaccine . Gave in left deltoid . Tolerated injection well . Advised pt to return in 6 to 12 weeks to received the second shingles vaccine . Gave shingles vaccine information sheet

## 2022-06-30 ENCOUNTER — Encounter (HOSPITAL_BASED_OUTPATIENT_CLINIC_OR_DEPARTMENT_OTHER): Payer: Self-pay | Admitting: Internal Medicine

## 2022-08-13 ENCOUNTER — Ambulatory Visit: Payer: Managed Care, Other (non HMO)

## 2022-08-16 ENCOUNTER — Other Ambulatory Visit: Payer: Self-pay | Admitting: Internal Medicine

## 2022-08-16 DIAGNOSIS — E7801 Familial hypercholesterolemia: Secondary | ICD-10-CM

## 2022-08-20 ENCOUNTER — Ambulatory Visit (INDEPENDENT_AMBULATORY_CARE_PROVIDER_SITE_OTHER): Payer: Managed Care, Other (non HMO) | Admitting: Family Medicine

## 2022-08-20 DIAGNOSIS — Z23 Encounter for immunization: Secondary | ICD-10-CM | POA: Diagnosis not present

## 2022-08-23 NOTE — Progress Notes (Signed)
Willie [name] is a 62 y.o. male presents to the office today for Shingles injections, per physician's orders.    [name]

## 2022-09-28 LAB — NMR, LIPOPROFILE
Cholesterol, Total: 136 mg/dL (ref 100–199)
HDL Particle Number: 35 umol/L (ref >=30.5)
HDL-C: 49 mg/dL (ref >39)
LDL Particle Number: 860 nmol/L (ref <1000)
LDL Size: 20.6 nm (ref >20.5)
LDL-C (NIH Calc): 69 mg/dL (ref 0–99)
LP-IR Score: 66 — ABNORMAL HIGH (ref <=45)
Small LDL Particle Number: 376 nmol/L (ref <=527)
Triglycerides: 98 mg/dL (ref 0–149)

## 2022-10-04 ENCOUNTER — Ambulatory Visit (INDEPENDENT_AMBULATORY_CARE_PROVIDER_SITE_OTHER): Payer: Managed Care, Other (non HMO) | Admitting: Internal Medicine

## 2022-10-04 ENCOUNTER — Encounter (HOSPITAL_BASED_OUTPATIENT_CLINIC_OR_DEPARTMENT_OTHER): Payer: Self-pay | Admitting: Internal Medicine

## 2022-10-04 VITALS — BP 126/70 | HR 88 | Ht 70.0 in | Wt 165.8 lb

## 2022-10-04 DIAGNOSIS — R931 Abnormal findings on diagnostic imaging of heart and coronary circulation: Secondary | ICD-10-CM

## 2022-10-04 DIAGNOSIS — I10 Essential (primary) hypertension: Secondary | ICD-10-CM

## 2022-10-04 DIAGNOSIS — E7801 Familial hypercholesterolemia: Secondary | ICD-10-CM | POA: Diagnosis not present
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
## 2022-10-04 NOTE — Patient Instructions (Signed)
Medication Instructions:  Your physician recommends that you continue on your current medications as directed. Please refer to the Current Medication list given to you today.  *If you need a refill on your cardiac medications before your next appointment, please call your pharmacy*   Lab Work: FASTING lab work to check cholesterol in 12 months   If you have labs (blood work) drawn today and your tests are completely normal, you will receive your results only by: MyChart Message (if you have MyChart) OR A paper copy in the mail If you have any lab test that is abnormal or we need to change your treatment, we will call you to review the results.    Follow-Up: At [facility], you and your health needs are our priority.  As part of our continuing mission to provide you with exceptional heart care, we have created designated Provider Care Teams.  These Care Teams include your primary Cardiologist (physician) and Advanced Practice Providers (APPs -  Physician Assistants and Nurse Practitioners) who all work together to provide you with the care you need, when you need it.  We recommend signing up for the patient portal called "MyChart".  Sign up information is provided on this After Visit Summary.  MyChart is used to connect with patients for Virtual Visits (Telemedicine).  Patients are able to view lab/test results, encounter notes, upcoming appointments, etc.  Non-urgent messages can be sent to your provider as well.   To learn more about what you can do with MyChart, go to [email or web address].    Your next appointment:    12 months with Dr. Rennis Golden

## 2022-12-22 ENCOUNTER — Ambulatory Visit (INDEPENDENT_AMBULATORY_CARE_PROVIDER_SITE_OTHER): Payer: Managed Care, Other (non HMO) | Admitting: Family Medicine

## 2022-12-22 ENCOUNTER — Encounter: Payer: Self-pay | Admitting: Family Medicine

## 2022-12-22 VITALS — BP 118/78 | HR 71 | Temp 98.3°F | Ht 69.25 in | Wt 168.0 lb

## 2022-12-22 DIAGNOSIS — I10 Essential (primary) hypertension: Secondary | ICD-10-CM

## 2022-12-22 DIAGNOSIS — R7303 Prediabetes: Secondary | ICD-10-CM

## 2022-12-22 DIAGNOSIS — Z0000 Encounter for general adult medical examination without abnormal findings: Secondary | ICD-10-CM | POA: Diagnosis not present

## 2022-12-22 DIAGNOSIS — E782 Mixed hyperlipidemia: Secondary | ICD-10-CM

## 2022-12-22 DIAGNOSIS — R931 Abnormal findings on diagnostic imaging of heart and coronary circulation: Secondary | ICD-10-CM

## 2022-12-22 LAB — COMPREHENSIVE METABOLIC PANEL
ALT: 36 U/L (ref 0–53)
AST: 25 U/L (ref 0–37)
Albumin: 4.4 g/dL (ref 3.5–5.2)
Alkaline Phosphatase: 59 U/L (ref 39–117)
BUN: 20 mg/dL (ref 6–23)
CO2: 30 mEq/L (ref 19–32)
Calcium: 10.1 mg/dL (ref 8.4–10.5)
Chloride: 102 mEq/L (ref 96–112)
Creatinine, Ser: 1.17 mg/dL (ref 0.40–1.50)
GFR: 67.51 mL/min (ref >60.00)
Glucose, Bld: 104 mg/dL — ABNORMAL HIGH (ref 70–99)
Potassium: 4.6 mEq/L (ref 3.5–5.1)
Sodium: 140 mEq/L (ref 135–145)
Total Bilirubin: 0.6 mg/dL (ref 0.2–1.2)
Total Protein: 7.3 g/dL (ref 6.0–8.3)

## 2022-12-22 LAB — LIPID PANEL
Cholesterol: 143 mg/dL (ref 0–200)
HDL: 48.9 mg/dL (ref >39.00)
LDL Cholesterol: 70 mg/dL (ref 0–99)
NonHDL: 94.3
Total CHOL/HDL Ratio: 3
Triglycerides: 120 mg/dL (ref 0.0–149.0)
VLDL: 24 mg/dL (ref 0.0–40.0)

## 2022-12-22 LAB — HEMOGLOBIN A1C: Hgb A1c MFr Bld: 6 % (ref 4.6–6.5)

## 2022-12-22 MED ORDER — ROSUVASTATIN CALCIUM 10 MG PO TABS: 10.0000 mg | ORAL_TABLET | Freq: Every day | ORAL | 3 refills | Status: DC

## 2022-12-22 MED ORDER — AMLODIPINE BESYLATE 10 MG PO TABS: 10.0000 mg | ORAL_TABLET | Freq: Every day | ORAL | 2 refills | Status: DC

## 2022-12-22 MED ORDER — LOSARTAN POTASSIUM 50 MG PO TABS: 50.0000 mg | ORAL_TABLET | Freq: Every day | ORAL | 2 refills | Status: DC

## 2022-12-22 NOTE — Patient Instructions (Addendum)
There is a recommendation for RSV vaccine for patients over age 61.   Typically would recommend the RSV vaccine as most important for patients over age 22 that have comorbidities that put them at increased risk for severe disease (heart disease such as congestive heart failure, coronary artery disease, lung disease such as asthma or COPD, kidney disease, liver disease, diabetes, chronic or progressive neurologic or muscular conditions, immunosuppressed, or being frail or of advanced age).  For others that do not have these risk factors, there still is some benefit from vaccination since age is one of the main risk factors for developing severe disease however baseline risk of developing severe disease and requiring hospitalization is likely to be lower compared to those that have comorbidities in addition to age.   CDC does have some information as well: [employer]  Schedule eye specialist and dental visit.  No med changes today. If any concerns on labs I will let you know. Take care!  Preventive Care 90-68 Years Old, Male Preventive care refers to lifestyle choices and visits with your health care provider that can promote health and wellness. Preventive care visits are also called wellness exams. What can I expect for my preventive care visit? Counseling During your preventive care visit, your health care provider may ask about your: Medical history, including: Past medical problems. Family medical history. Current health, including: Emotional well-being. Home life and relationship well-being. Sexual activity. Lifestyle, including: Alcohol, nicotine or tobacco, and drug use. Access to firearms. Diet, exercise, and sleep habits. Safety issues such as seatbelt and bike helmet use. Sunscreen use. Work and work Astronomer. Physical exam Your health care provider will check your: Height and weight. These may be used to calculate your BMI  (body mass index). BMI is a measurement that tells if you are at a healthy weight. Waist circumference. This measures the distance around your waistline. This measurement also tells if you are at a healthy weight and may help predict your risk of certain diseases, such as type 2 diabetes and high blood pressure. Heart rate and blood pressure. Body temperature. Skin for abnormal spots. What immunizations do I need?  Vaccines are usually given at various ages, according to a schedule. Your health care provider will recommend vaccines for you based on your age, medical history, and lifestyle or other factors, such as travel or where you work. What tests do I need? Screening Your health care provider may recommend screening tests for certain conditions. This may include: Lipid and cholesterol levels. Diabetes screening. This is done by checking your blood sugar (glucose) after you have not eaten for a while (fasting). Hepatitis B test. Hepatitis C test. HIV (human immunodeficiency virus) test. STI (sexually transmitted infection) testing, if you are at risk. Lung cancer screening. Prostate cancer screening. Colorectal cancer screening. Talk with your health care provider about your test results, treatment options, and if necessary, the need for more tests. Follow these instructions at home: Eating and drinking  Eat a diet that includes fresh fruits and vegetables, whole grains, lean protein, and low-fat dairy products. Take vitamin and mineral supplements as recommended by your health care provider. Do not drink alcohol if your health care provider tells you not to drink. If you drink alcohol: Limit how much you have to 0-2 drinks a day. Know how much alcohol is in your drink. In the [place] one drink equals one 12 oz bottle of beer (355 mL), one 5 oz glass of wine (148 mL), or one 1 oz  glass of hard liquor (44 mL). Lifestyle Brush your teeth every morning and night with fluoride  toothpaste. Floss one time each day. Exercise for at least 30 minutes 5 or more days each week. Do not use any products that contain nicotine or tobacco. These products include cigarettes, chewing tobacco, and vaping devices, such as e-cigarettes. If you need help quitting, ask your health care provider. Do not use drugs. If you are sexually active, practice safe sex. Use a condom or other form of protection to prevent STIs. Take aspirin only as told by your health care provider. Make sure that you understand how much to take and what form to take. Work with your health care provider to find out whether it is safe and beneficial for you to take aspirin daily. Find healthy ways to manage stress, such as: Meditation, yoga, or listening to music. Journaling. Talking to a trusted person. Spending time with friends and family. Minimize exposure to UV radiation to reduce your risk of skin cancer. Safety Always wear your seat belt while driving or riding in a vehicle. Do not drive: If you have been drinking alcohol. Do not ride with someone who has been drinking. When you are tired or distracted. While texting. If you have been using any mind-altering substances or drugs. Wear a helmet and other protective equipment during sports activities. If you have firearms in your house, make sure you follow all gun safety procedures. What's next? Go to your health care provider once a year for an annual wellness visit. Ask your health care provider how often you should have your eyes and teeth checked. Stay up to date on all vaccines. This information is not intended to replace advice given to you by your health care provider. Make sure you discuss any questions you have with your health care provider. Document Revised: 11/19/2020 Document Reviewed: 11/19/2020 Elsevier Patient Education  2024 [employer].

## 2022-12-22 NOTE — Progress Notes (Signed)
Subjective:  Patient ID: Willie [name], male    DOB: May 24, 1961  Age: 62 y.o. MRN: [id number]  CC:  Chief Complaint  Patient presents with   Annual Exam    Pt notes no concern, fasting     HPI Willie [name] presents for Annual Exam  Hypertension: Treated with amlodipine 10 mg daily, losartan 50 mg daily - no new side effects.  Home readings: 118-121/70's.  BP Readings from Last 3 Encounters:  12/22/22 118/78  10/04/22 126/70  06/21/22 122/76   Lab Results  Component Value Date   CREATININE 1.20 06/21/2022   GERD Pepcid as needed - working well. No recent need  Allergic rhinitis Treated with Zyrtec as needed - not daily. Stable.   Prediabetes: Diet/exercise approach, exercise a few days per week at last visit with minimal fast food or sugar containing beverages.  Weight up a few pounds. Still few days per week exercise and watching diet.  Lab Results  Component Value Date   HGBA1C 6.3 06/21/2022   Wt Readings from Last 3 Encounters:  12/22/22 168 lb (76.2 kg)  10/04/22 165 lb 12.8 oz (75.2 kg)  06/21/22 162 lb 6.4 oz (73.7 kg)   Hyperlipidemia: Crestor 10 mg daily, slight elevated LDL in July 2023.  Has been followed by lipid specialist, Dr. Rennis Golden with possible familial hyperlipidemia, family history of early onset heart disease, and high coronary artery calcium scoring.  Last visit April 29.  Continued on current regimen.  Annual follow-up. Lab Results  Component Value Date   CHOL 192 12/25/2021   HDL 49.80 12/25/2021   LDLCALC 114 (H) 12/25/2021   LDLDIRECT 103.0 06/19/2021   TRIG 142.0 12/25/2021   CHOLHDL 4 12/25/2021   Lab Results  Component Value Date   ALT 33 06/21/2022   AST 27 06/21/2022   ALKPHOS 57 06/21/2022   BILITOT 0.5 06/21/2022           12/22/2022    8:30 AM 06/21/2022    8:18 AM 12/25/2021    8:37 AM 06/19/2021    7:52 AM 12/17/2020    9:35 AM  Depression screen PHQ 2/9  Decreased Interest 0 0 0 0 0  Down, Depressed, Hopeless 0 0  0 0 0  PHQ - 2 Score 0 0 0 0 0  Altered sleeping 0 0  0   Tired, decreased energy 0 0  0   Change in appetite 0 0  0   Feeling bad or failure about yourself  0 0  0   Trouble concentrating 0 0  0   Moving slowly or fidgety/restless 0 0  0   Suicidal thoughts 0 0  0   PHQ-9 Score 0 0  0   Difficult doing work/chores    Not difficult at all     Health Maintenance  Topic Date Due   COVID-19 Vaccine (1 - 2023-24 season) Never done   Zoster Vaccines- Shingrix (2 of 2) 10/15/2022   INFLUENZA VACCINE  01/06/2023   DTaP/Tdap/Td (2 - Td or Tdap) 12/13/2027   Colonoscopy  06/20/2028   Hepatitis C Screening  Completed   HIV Screening  Completed   HPV VACCINES  Aged Out  Colonoscopy: 06/20/18, Dr. Myrtie Neither, normal, repeat 79yrs.  Prostate: does not have family history of prostate cancer The natural history of prostate cancer and ongoing controversy regarding screening and potential treatment outcomes of prostate cancer has been discussed with the patient. The meaning of a false positive PSA and a  false negative PSA has been discussed. He indicates understanding of the limitations of this screening test and wishes NOT to proceed with screening PSA testing. Lab Results  Component Value Date   PSA1 2.3 06/15/2018    Immunization History  Administered Date(s) Administered   Influenza,inj,Quad PF,6+ Mos 06/18/2019, 06/19/2020   Tdap 12/12/2017   Zoster Recombinant(Shingrix) 08/20/2022   Zoster, Live 06/25/2022  Shingrix in January and March.  Covid vaccine: booster recommended RSV vaccine: discussed, handout given.   No results found. Optho yearly, overdue - will schedule.   Dental: last visit few yrs ago - plans to schedule.   Alcohol: none  Tobacco: none  Exercise: 3 days per week, at least 30 min. Some hiking, walks, pickle ball, mowing grass.  Has been seen by ortho for left frozen shoulder,  still some issues. Trouble taking time off work at this time for additional treatment.    History Patient Active Problem List   Diagnosis Date Noted   Adhesive capsulitis of left shoulder 09/13/2020   Recurrent umbilical hernia 06/29/2011   Past Medical History:  Diagnosis Date   Allergy    seasonal   Dyslipidemia    GERD (gastroesophageal reflux disease)    Hypertension    dr Merla Riches   Umbilical hernia    Past Surgical History:  Procedure Laterality Date   HERNIA REPAIR  01/2002   UMBILICAL HERNIA REPAIR  09/01/2011   Procedure: HERNIA REPAIR UMBILICAL ADULT;  Surgeon: Shelly Rubenstein, MD;  Location: [facility] OR;  Service: General;  Laterality: N/A;   Allergies  Allergen Reactions   Penicillins Hives, Nausea And Vomiting and Swelling   Prior to Admission medications   Medication Sig Start Date End Date Taking? Authorizing Provider  amLODipine (NORVASC) 10 MG tablet Take 1 tablet (10 mg total) by mouth daily. 06/21/22  Yes Shade Flood, MD  cetirizine (ZYRTEC) 10 MG tablet Take 1 tablet (10 mg total) by mouth daily. 04/07/17  Yes English, Judeth Cornfield D, PA  diphenhydrAMINE (BENADRYL) 25 MG tablet Take 25 mg by mouth as needed.   Yes [provider]  famotidine (PEPCID) 20 MG tablet Take 20 mg by mouth as needed for heartburn or indigestion.   Yes [provider]  losartan (COZAAR) 50 MG tablet Take 1 tablet (50 mg total) by mouth daily. 06/21/22  Yes Shade Flood, MD  Multiple Vitamins-Minerals (CENTRUM SILVER 50+MEN PO) Take by mouth daily.   Yes [provider]  rosuvastatin (CRESTOR) 10 MG tablet Take 1 tablet (10 mg total) by mouth daily. 06/21/22  Yes Shade Flood, MD   Social History   Socioeconomic History   Marital status: Married    Spouse name: Not on file   Number of children: Not on file   Years of education: Not on file   Highest education level: Not on file  Occupational History   Not on file  Tobacco Use   Smoking status: Never   Smokeless tobacco: Never  Substance and Sexual Activity   Alcohol use: No    Drug use: No   Sexual activity: Yes  Other Topics Concern   Not on file  Social History Narrative   Not on file   Social Determinants of Health   Financial Resource Strain: Not on file  Food Insecurity: Not on file  Transportation Needs: Not on file  Physical Activity: Not on file  Stress: Not on file (07/15/2019)  Social Connections: Not on file  Intimate Partner Violence: Low Risk  (  09/18/2019)   Received from [facility]   Intimate Partner Violence    Insults You: Not on file    Threatens You: Not on file    Screams at You: Not on file    Physically Hurt: Not on file    Intimate Partner Violence Score: Not on file    Review of Systems 13 point review of systems per patient health survey noted.  Negative other than as indicated above or in HPI.   Objective:   Vitals:   12/22/22 0833  BP: 118/78  Pulse: 71  Temp: 98.3 F (36.8 C)  TempSrc: Temporal  SpO2: 98%  Weight: 168 lb (76.2 kg)  Height: 5' 9.25" (1.759 m)     Physical Exam Vitals reviewed.  Constitutional:      Appearance: He is well-developed.  HENT:     Head: Normocephalic and atraumatic.     Right Ear: External ear normal.     Left Ear: External ear normal.  Eyes:     Conjunctiva/sclera: Conjunctivae normal.     Pupils: Pupils are equal, round, and reactive to light.  Neck:     Thyroid: No thyromegaly.  Cardiovascular:     Rate and Rhythm: Normal rate and regular rhythm.     Heart sounds: Normal heart sounds.  Pulmonary:     Effort: Pulmonary effort is normal. No respiratory distress.     Breath sounds: Normal breath sounds. No wheezing.  Abdominal:     General: There is no distension.     Palpations: Abdomen is soft.     Tenderness: There is no abdominal tenderness.  Musculoskeletal:        General: No tenderness. Normal range of motion.     Cervical back: Normal range of motion and neck supple.  Lymphadenopathy:     Cervical: No cervical adenopathy.  Skin:     General: Skin is warm and dry.  Neurological:     Mental Status: He is alert and oriented to person, place, and time.     Deep Tendon Reflexes: Reflexes are normal and symmetric.  Psychiatric:        Behavior: Behavior normal.        Assessment & Plan:  Willie [name] is a 62 y.o. male . Annual physical exam  -anticipatory guidance as below in AVS, screening labs above. Health maintenance items as above in HPI discussed/recommended as applicable.   Mixed hyperlipidemia Elevated coronary artery calcium score  -  Stable, tolerating current regimen. Medications refilled. Labs pending as above.   Essential hypertension  -  Stable, tolerating current regimen. Medications refilled. Labs pending as above.    Prediabetes  -Continue to watch diet, exercise.  Check A1c.  Adjust plan accordingly.    Meds ordered this encounter  Medications   rosuvastatin (CRESTOR) 10 MG tablet    Sig: Take 1 tablet (10 mg total) by mouth daily.    Dispense:  90 tablet    Refill:  3   amLODipine (NORVASC) 10 MG tablet    Sig: Take 1 tablet (10 mg total) by mouth daily.    Dispense:  90 tablet    Refill:  2   losartan (COZAAR) 50 MG tablet    Sig: Take 1 tablet (50 mg total) by mouth daily.    Dispense:  90 tablet    Refill:  2   Patient Instructions  There is a recommendation for RSV vaccine for patients over age 74.   Typically would recommend  the RSV vaccine as most important for patients over age 73 that have comorbidities that put them at increased risk for severe disease (heart disease such as congestive heart failure, coronary artery disease, lung disease such as asthma or COPD, kidney disease, liver disease, diabetes, chronic or progressive neurologic or muscular conditions, immunosuppressed, or being frail or of advanced age).  For others that do not have these risk factors, there still is some benefit from vaccination since age is one of the main risk factors for developing severe disease  however baseline risk of developing severe disease and requiring hospitalization is likely to be lower compared to those that have comorbidities in addition to age.   CDC does have some information as well: [employer]  Schedule eye specialist and dental visit.  No med changes today. If any concerns on labs I will let you know. Take care!  Preventive Care 76-18 Years Old, Male Preventive care refers to lifestyle choices and visits with your health care provider that can promote health and wellness. Preventive care visits are also called wellness exams. What can I expect for my preventive care visit? Counseling During your preventive care visit, your health care provider may ask about your: Medical history, including: Past medical problems. Family medical history. Current health, including: Emotional well-being. Home life and relationship well-being. Sexual activity. Lifestyle, including: Alcohol, nicotine or tobacco, and drug use. Access to firearms. Diet, exercise, and sleep habits. Safety issues such as seatbelt and bike helmet use. Sunscreen use. Work and work Astronomer. Physical exam Your health care provider will check your: Height and weight. These may be used to calculate your BMI (body mass index). BMI is a measurement that tells if you are at a healthy weight. Waist circumference. This measures the distance around your waistline. This measurement also tells if you are at a healthy weight and may help predict your risk of certain diseases, such as type 2 diabetes and high blood pressure. Heart rate and blood pressure. Body temperature. Skin for abnormal spots. What immunizations do I need?  Vaccines are usually given at various ages, according to a schedule. Your health care provider will recommend vaccines for you based on your age, medical history, and lifestyle or other factors, such as travel or where you work. What  tests do I need? Screening Your health care provider may recommend screening tests for certain conditions. This may include: Lipid and cholesterol levels. Diabetes screening. This is done by checking your blood sugar (glucose) after you have not eaten for a while (fasting). Hepatitis B test. Hepatitis C test. HIV (human immunodeficiency virus) test. STI (sexually transmitted infection) testing, if you are at risk. Lung cancer screening. Prostate cancer screening. Colorectal cancer screening. Talk with your health care provider about your test results, treatment options, and if necessary, the need for more tests. Follow these instructions at home: Eating and drinking  Eat a diet that includes fresh fruits and vegetables, whole grains, lean protein, and low-fat dairy products. Take vitamin and mineral supplements as recommended by your health care provider. Do not drink alcohol if your health care provider tells you not to drink. If you drink alcohol: Limit how much you have to 0-2 drinks a day. Know how much alcohol is in your drink. In the [place] one drink equals one 12 oz bottle of beer (355 mL), one 5 oz glass of wine (148 mL), or one 1 oz glass of hard liquor (44 mL). Lifestyle Brush your teeth every morning and night with fluoride  toothpaste. Floss one time each day. Exercise for at least 30 minutes 5 or more days each week. Do not use any products that contain nicotine or tobacco. These products include cigarettes, chewing tobacco, and vaping devices, such as e-cigarettes. If you need help quitting, ask your health care provider. Do not use drugs. If you are sexually active, practice safe sex. Use a condom or other form of protection to prevent STIs. Take aspirin only as told by your health care provider. Make sure that you understand how much to take and what form to take. Work with your health care provider to find out whether it is safe and beneficial for you to take aspirin  daily. Find healthy ways to manage stress, such as: Meditation, yoga, or listening to music. Journaling. Talking to a trusted person. Spending time with friends and family. Minimize exposure to UV radiation to reduce your risk of skin cancer. Safety Always wear your seat belt while driving or riding in a vehicle. Do not drive: If you have been drinking alcohol. Do not ride with someone who has been drinking. When you are tired or distracted. While texting. If you have been using any mind-altering substances or drugs. Wear a helmet and other protective equipment during sports activities. If you have firearms in your house, make sure you follow all gun safety procedures. What's next? Go to your health care provider once a year for an annual wellness visit. Ask your health care provider how often you should have your eyes and teeth checked. Stay up to date on all vaccines. This information is not intended to replace advice given to you by your health care provider. Make sure you discuss any questions you have with your health care provider. Document Revised: 11/19/2020 Document Reviewed: 11/19/2020 Elsevier Patient Education  2024 [employer]     Signed,   Meredith Staggers, MD  Primary Care, [facility] 12/22/22 8:58 AM

## 2022-12-25 ENCOUNTER — Other Ambulatory Visit: Payer: Self-pay | Admitting: Family Medicine

## 2022-12-25 NOTE — Progress Notes (Signed)
Opened in error

## 2023-01-10 ENCOUNTER — Encounter: Payer: Self-pay | Admitting: Family Medicine

## 2023-01-10 ENCOUNTER — Telehealth (INDEPENDENT_AMBULATORY_CARE_PROVIDER_SITE_OTHER): Payer: Managed Care, Other (non HMO) | Admitting: Family Medicine

## 2023-01-10 DIAGNOSIS — R509 Fever, unspecified: Secondary | ICD-10-CM | POA: Diagnosis not present

## 2023-01-10 DIAGNOSIS — U071 COVID-19: Secondary | ICD-10-CM | POA: Diagnosis not present

## 2023-01-10 DIAGNOSIS — R051 Acute cough: Secondary | ICD-10-CM | POA: Diagnosis not present

## 2023-01-10 MED ORDER — NIRMATRELVIR/RITONAVIR (PAXLOVID)TABLET: 3.0000 | ORAL_TABLET | Freq: Two times a day (BID) | ORAL | 0 refills | Status: AC

## 2023-01-10 NOTE — Patient Instructions (Signed)
Sorry to hear that you are sick.  I am suspicious of a viral illness and COVID is still a possibility.  I do recommend testing today, and let me know those results.  If it is positive then we can send in the Paxlovid.  I will let you know.  For now continue to drink plenty of fluids, rest, Mucinex or Mucinex DM can be helpful for cough.  Let me know if a stronger med is needed at night.  Tylenol as needed for body aches or fever.  If you have any shortness of breath at rest, chest pain, confusion or other acute worsening symptoms be seen in the emergency room.  I do not expect this to occur.  Hang in there!

## 2023-01-10 NOTE — Progress Notes (Addendum)
Virtual Visit via Video Note  I connected with Willie [name] on 01/10/23 at 12:51 PM by a video enabled telemedicine application and verified that I am speaking with the correct person using two identifiers.  Patient location: home, by self My location: office - [place].    I discussed the limitations, risks, security and privacy concerns of performing an evaluation and management service by telephone and the availability of in person appointments. I also discussed with the patient that there may be a patient responsible charge related to this service. The patient expressed understanding and agreed to proceed, consent obtained  Chief complaint:  Chief Complaint  Patient presents with   Cough    Pt notes started late Friday afternoon sore throat, congestion, sinus drainage, headache, body weakness, chest heaviness, cough on and off, wife was sick previously she was negative for COVID     History of Present Illness: Willie [name] is a 62 y.o. male  Cough: Started 3 days ago with sore throat, congestion, sinus drainage, HA. Cough since yesterday, sporadic, more today. Clear mucus. No wheezing, heavy congestion in chest. A little short of breath walking across room or playing with dog, not at rest.  More HA, body aches initial few days - better.  Tmax 100.2 2 days ago. Fatigue. No rash.  Decreased appetite.  Drinking water/fluids.  No confusion/disorientation.  Tx: nyquil HBP. Helped to sleep. Tylenol.  Has not checked for covid. Will be checking later with home test.  Has tolerated antiviral paxlovid in past.  Egfr 67 in July.  Wife sick last week- 3 days prior - negative covid test at day 1-2 of sx's. She felt bad initial few days, then improved and better now.   Had been traveling recently.   Immunization History  Administered Date(s) Administered   Influenza,inj,Quad PF,6+ Mos 06/18/2019, 06/19/2020   PFIZER(Purple Top)SARS-COV-2 Vaccination 08/29/2019, 09/19/2019,  04/29/2020   Tdap 12/12/2017   Zoster Recombinant(Shingrix) 08/20/2022   Zoster, Live 06/25/2022   Last covid infection in 03/2022. No recent covid booster.    Patient Active Problem List   Diagnosis Date Noted   Adhesive capsulitis of left shoulder 09/13/2020   Recurrent umbilical hernia 06/29/2011   Past Medical History:  Diagnosis Date   Allergy    seasonal   Dyslipidemia    GERD (gastroesophageal reflux disease)    Hypertension    dr Merla Riches   Umbilical hernia    Past Surgical History:  Procedure Laterality Date   HERNIA REPAIR  01/2002   UMBILICAL HERNIA REPAIR  09/01/2011   Procedure: HERNIA REPAIR UMBILICAL ADULT;  Surgeon: Shelly Rubenstein, MD;  Location: [facility] OR;  Service: General;  Laterality: N/A;   Allergies  Allergen Reactions   Penicillins Hives, Nausea And Vomiting and Swelling   Prior to Admission medications   Medication Sig Start Date End Date Taking? Authorizing Provider  amLODipine (NORVASC) 10 MG tablet Take 1 tablet (10 mg total) by mouth daily. 12/22/22  Yes Shade Flood, MD  cetirizine (ZYRTEC) 10 MG tablet Take 1 tablet (10 mg total) by mouth daily. 04/07/17  Yes English, Judeth Cornfield D, PA  diphenhydrAMINE (BENADRYL) 25 MG tablet Take 25 mg by mouth as needed.   Yes [provider]  famotidine (PEPCID) 20 MG tablet Take 20 mg by mouth as needed for heartburn or indigestion.   Yes [provider]  losartan (COZAAR) 50 MG tablet Take 1 tablet (50 mg total) by mouth daily. 12/22/22  Yes Shade Flood,  MD  Multiple Vitamins-Minerals (CENTRUM SILVER 50+MEN PO) Take by mouth daily.   Yes [provider]  rosuvastatin (CRESTOR) 10 MG tablet Take 1 tablet (10 mg total) by mouth daily. 12/22/22  Yes Shade Flood, MD   Social History   Socioeconomic History   Marital status: Married    Spouse name: Not on file   Number of children: Not on file   Years of education: Not on file   Highest education level: Not on file   Occupational History   Not on file  Tobacco Use   Smoking status: Never   Smokeless tobacco: Never  Substance and Sexual Activity   Alcohol use: No   Drug use: No   Sexual activity: Yes  Other Topics Concern   Not on file  Social History Narrative   Not on file   Social Determinants of Health   Financial Resource Strain: Not on file  Food Insecurity: Not on file  Transportation Needs: Not on file  Physical Activity: Not on file  Stress: Not on file (07/15/2019)  Social Connections: Not on file  Intimate Partner Violence: Low Risk  (09/18/2019)   Received from [facility]   Intimate Partner Violence    Insults You: Not on file    Threatens You: Not on file    Screams at You: Not on file    Physically Hurt: Not on file    Intimate Partner Violence Score: Not on file    Observations/Objective: There were no vitals filed for this visit. Nontoxic appearance on video.  Speaking full sentences without respiratory distress.  Coherent responses.  No audible wheeze or stridor.  All questions answered with understanding plan expressed.  Assessment and Plan: Acute cough  Fever, unspecified fever cause With sick contact and recent travel suspicious for COVID-19, spouses negative test on day 1-2 may have been false negative.  Recommend he check COVID-19 testing today and he will let me know results.  Consider Paxlovid if positive as he has tolerated previously, but limitations, risks and side effects were discussed.  Would need to stop statin for 1 week and monitor blood pressure to see if lower dose of amlodipine needed.  Symptomatic care discussed for now with Mucinex or Mucinex DM, fluids, rest and ER precautions if chest pain, shortness of breath at rest or other acute worsening.  Understanding expressed.  Follow Up Instructions: As needed.   Patient Instructions  Sorry to hear that you are sick.  I am suspicious of a viral illness and COVID is still a  possibility.  I do recommend testing today, and let me know those results.  If it is positive then we can send in the Paxlovid.  I will let you know.  For now continue to drink plenty of fluids, rest, Mucinex or Mucinex DM can be helpful for cough.  Let me know if a stronger med is needed at night.  Tylenol as needed for body aches or fever.  If you have any shortness of breath at rest, chest pain, confusion or other acute worsening symptoms be seen in the emergency room.  I do not expect this to occur.  Hang in there!  I discussed the assessment and treatment plan with the patient. The patient was provided an opportunity to ask questions and all were answered. The patient agreed with the plan and demonstrated an understanding of the instructions.   The patient was advised to call back or seek an in-person evaluation if the  symptoms worsen or if the condition fails to improve as anticipated.   Shade Flood, MD  5:06 PM MyChart note reviewed, COVID test positive at home.  Paxlovid sent, potential side effects, risks discussed.  Stop Crestor for [redacted] week along with monitoring blood pressure - can lower dose of amlodipine to one half temporarily if running lower with use of Paxlovid..  Patient advised.

## 2023-01-10 NOTE — Addendum Note (Signed)
Addended by: Meredith Staggers R on: 01/10/2023 05:12 PM   Modules accepted: Orders

## 2023-01-11 NOTE — Telephone Encounter (Signed)
Noted - see OV addendum.

## 2023-06-24 ENCOUNTER — Ambulatory Visit: Payer: Managed Care, Other (non HMO) | Admitting: Family Medicine

## 2023-06-24 ENCOUNTER — Encounter: Payer: Self-pay | Admitting: Family Medicine

## 2023-06-24 VITALS — BP 128/70 | HR 84 | Temp 98.8°F | Ht 69.25 in | Wt 172.6 lb

## 2023-06-24 DIAGNOSIS — E782 Mixed hyperlipidemia: Secondary | ICD-10-CM

## 2023-06-24 DIAGNOSIS — I10 Essential (primary) hypertension: Secondary | ICD-10-CM

## 2023-06-24 DIAGNOSIS — R931 Abnormal findings on diagnostic imaging of heart and coronary circulation: Secondary | ICD-10-CM

## 2023-06-24 DIAGNOSIS — R7303 Prediabetes: Secondary | ICD-10-CM

## 2023-06-24 LAB — LIPID PANEL
Cholesterol: 139 mg/dL (ref 0–200)
HDL: 48.4 mg/dL (ref >39.00)
LDL Cholesterol: 66 mg/dL (ref 0–99)
NonHDL: 90.62
Total CHOL/HDL Ratio: 3
Triglycerides: 125 mg/dL (ref 0.0–149.0)
VLDL: 25 mg/dL (ref 0.0–40.0)

## 2023-06-24 LAB — COMPREHENSIVE METABOLIC PANEL
ALT: 35 U/L (ref 0–53)
AST: 25 U/L (ref 0–37)
Albumin: 4.4 g/dL (ref 3.5–5.2)
Alkaline Phosphatase: 55 U/L (ref 39–117)
BUN: 18 mg/dL (ref 6–23)
CO2: 30 meq/L (ref 19–32)
Calcium: 9.7 mg/dL (ref 8.4–10.5)
Chloride: 103 meq/L (ref 96–112)
Creatinine, Ser: 1.11 mg/dL (ref 0.40–1.50)
GFR: 71.66 mL/min (ref >60.00)
Glucose, Bld: 102 mg/dL — ABNORMAL HIGH (ref 70–99)
Potassium: 4.4 meq/L (ref 3.5–5.1)
Sodium: 141 meq/L (ref 135–145)
Total Bilirubin: 0.5 mg/dL (ref 0.2–1.2)
Total Protein: 7.2 g/dL (ref 6.0–8.3)

## 2023-06-24 LAB — HEMOGLOBIN A1C: Hgb A1c MFr Bld: 6.2 % (ref 4.6–6.5)

## 2023-06-24 MED ORDER — AMLODIPINE BESYLATE 10 MG PO TABS: 10.0000 mg | ORAL_TABLET | Freq: Every day | ORAL | 2 refills | Status: DC

## 2023-06-24 MED ORDER — ROSUVASTATIN CALCIUM 10 MG PO TABS: 10.0000 mg | ORAL_TABLET | Freq: Every day | ORAL | 3 refills | Status: DC

## 2023-06-24 MED ORDER — LOSARTAN POTASSIUM 50 MG PO TABS: 50.0000 mg | ORAL_TABLET | Freq: Every day | ORAL | 2 refills | Status: DC

## 2023-06-24 NOTE — Addendum Note (Signed)
Addended by: Eldred Manges on: 06/24/2023 08:21 AM   Modules accepted: Orders

## 2023-06-24 NOTE — Patient Instructions (Signed)
Thanks for coming in today.  No medication changes at this time.  Keep follow-up with Dr. Rennis Golden as planned.  If any concerns on labs I will let you know.  Take care!

## 2023-06-24 NOTE — Progress Notes (Signed)
Subjective:  Patient ID: Willie [name], male    DOB: June 30, 1960  Age: 63 y.o. MRN: [id number]  CC:  Chief Complaint  Patient presents with   Medical Management of Chronic Issues    Pt is doing well no concerns at this time     HPI Willie [name] presents for  Follow-up of chronic conditions. No concerns.   Hypertension: Amlodipine 10 mg daily, losartan 50 mg daily without any new side effects. Home readings: 118-120/70-82 BP Readings from Last 3 Encounters:  06/24/23 128/70  12/22/22 118/78  10/04/22 126/70   Lab Results  Component Value Date   CREATININE 1.17 12/22/2022   GERD Has treated with Pepcid as needed previously, trigger avoidance.  This continues to work well.  Hyperlipidemia: Crestor 10 mg daily, has been seen by lipid specialist, Dr. Rennis Golden with possible familial hyperlipidemia, family history of early onset heart disease and elevated coronary calcium scoring.  Annual follow-up  - in May Still taking Crestor without any new side effect/myalgias.  Lab Results  Component Value Date   CHOL 143 12/22/2022   HDL 48.90 12/22/2022   LDLCALC 70 12/22/2022   LDLDIRECT 103.0 06/19/2021   TRIG 120.0 12/22/2022   CHOLHDL 3 12/22/2022   Lab Results  Component Value Date   ALT 36 12/22/2022   AST 25 12/22/2022   ALKPHOS 59 12/22/2022   BILITOT 0.6 12/22/2022   Prediabetes: Diet/exercise approach.  Weight up a few pounds since last visit.  Has tried to avoid sugar containing beverages.  Still trying to watch diet. Exercise - some, hiking, walking, dancing.   Lab Results  Component Value Date   HGBA1C 6.0 12/22/2022   Wt Readings from Last 3 Encounters:  06/24/23 172 lb 9.6 oz (78.3 kg)  12/22/22 168 lb (76.2 kg)  10/04/22 165 lb 12.8 oz (75.2 kg)   Allergic rhinitis  Zyrtec as needed.  Stable.   HM:  Covid booster recommended.  Flu vaccine - declined.   History Patient Active Problem List   Diagnosis Date Noted   Adhesive capsulitis of left shoulder  09/13/2020   Recurrent umbilical hernia 06/29/2011   Past Medical History:  Diagnosis Date   Allergy    seasonal   Dyslipidemia    GERD (gastroesophageal reflux disease)    Hypertension    dr Merla Riches   Umbilical hernia    Past Surgical History:  Procedure Laterality Date   HERNIA REPAIR  01/2002   UMBILICAL HERNIA REPAIR  09/01/2011   Procedure: HERNIA REPAIR UMBILICAL ADULT;  Surgeon: Shelly Rubenstein, MD;  Location: [facility] OR;  Service: General;  Laterality: N/A;   Allergies  Allergen Reactions   Penicillins Hives, Nausea And Vomiting and Swelling   Prior to Admission medications   Medication Sig Start Date End Date Taking? Authorizing Provider  amLODipine (NORVASC) 10 MG tablet Take 1 tablet (10 mg total) by mouth daily. 12/22/22  Yes Shade Flood, MD  cetirizine (ZYRTEC) 10 MG tablet Take 1 tablet (10 mg total) by mouth daily. 04/07/17  Yes English, Judeth Cornfield D, PA  diphenhydrAMINE (BENADRYL) 25 MG tablet Take 25 mg by mouth as needed.   Yes [provider]  famotidine (PEPCID) 20 MG tablet Take 20 mg by mouth as needed for heartburn or indigestion.   Yes [provider]  losartan (COZAAR) 50 MG tablet Take 1 tablet (50 mg total) by mouth daily. 12/22/22  Yes Shade Flood, MD  Multiple Vitamins-Minerals (CENTRUM SILVER 50+MEN PO) Take by mouth  daily.   Yes [provider]  rosuvastatin (CRESTOR) 10 MG tablet Take 1 tablet (10 mg total) by mouth daily. 12/22/22  Yes Shade Flood, MD   Social History   Socioeconomic History   Marital status: Married    Spouse name: Not on file   Number of children: Not on file   Years of education: Not on file   Highest education level: Bachelor's degree (e.g., BA, AB, BS)  Occupational History   Not on file  Tobacco Use   Smoking status: Never   Smokeless tobacco: Never  Substance and Sexual Activity   Alcohol use: No   Drug use: No   Sexual activity: Yes  Other Topics Concern   Not on file   Social History Narrative   Not on file   Social Drivers of Health   Financial Resource Strain: Low Risk  (06/23/2023)   Overall Financial Resource Strain (CARDIA)    Difficulty of Paying Living Expenses: Not very hard  Food Insecurity: Food Insecurity Present (06/23/2023)   Hunger Vital Sign    Worried About Running Out of Food in the Last Year: Sometimes true    Ran Out of Food in the Last Year: Sometimes true  Transportation Needs: No Transportation Needs (06/23/2023)   PRAPARE - Administrator, Civil Service (Medical): No    Lack of Transportation (Non-Medical): No  Physical Activity: Sufficiently Active (06/23/2023)   Exercise Vital Sign    Days of Exercise per Week: 5 days    Minutes of Exercise per Session: 40 min  Stress: No Stress Concern Present (06/23/2023)   [employer] of Occupational Health - Occupational Stress Questionnaire    Feeling of Stress : Only a little  Social Connections: Socially Integrated (06/23/2023)   Social Connection and Isolation Panel [NHANES]    Frequency of Communication with Friends and Family: More than three times a week    Frequency of Social Gatherings with Friends and Family: Twice a week    Attends Religious Services: More than 4 times per year    Active Member of [employer] or Organizations: Yes    Attends Banker Meetings: More than 4 times per year    Marital Status: Married  Catering manager Violence: Low Risk  (09/18/2019)   Received from [employer], [facility]   Intimate Partner Violence    Insults You: Not on file    Threatens You: Not on file    Screams at [place]: Not on file    Physically Hurt: Not on file    Intimate Partner Violence Score: Not on file    Review of Systems  Constitutional:  Negative for fatigue and unexpected weight change.  Eyes:  Negative for visual disturbance.  Respiratory:  Negative for cough, chest tightness and shortness of breath.   Cardiovascular:  Negative for chest  pain, palpitations and leg swelling.  Gastrointestinal:  Negative for abdominal pain and blood in stool.  Neurological:  Negative for dizziness, light-headedness and headaches.     Objective:   Vitals:   06/24/23 0810  BP: 128/70  Pulse: 84  Temp: 98.8 F (37.1 C)  TempSrc: Temporal  SpO2: 98%  Weight: 172 lb 9.6 oz (78.3 kg)  Height: 5' 9.25" (1.759 m)     Physical Exam Vitals reviewed.  Constitutional:      Appearance: He is well-developed.  HENT:     Head: Normocephalic and atraumatic.  Neck:     Vascular: No carotid bruit or JVD.  Cardiovascular:     Rate and Rhythm: Normal rate and regular rhythm.     Heart sounds: Normal heart sounds. No murmur heard. Pulmonary:     Effort: Pulmonary effort is normal.     Breath sounds: Normal breath sounds. No rales.  Musculoskeletal:     Right lower leg: No edema.     Left lower leg: No edema.  Skin:    General: Skin is warm and dry.  Neurological:     Mental Status: He is alert and oriented to person, place, and time.  Psychiatric:        Mood and Affect: Mood normal.        Assessment & Plan:  Willie [name] is a 63 y.o. male . Essential hypertension - Plan: Comprehensive metabolic panel, amLODipine (NORVASC) 10 MG tablet, losartan (COZAAR) 50 MG tablet  -Well-controlled on current regimen, check labs and adjust plan accordingly.  68-month follow-up  Mixed hyperlipidemia - Plan: Comprehensive metabolic panel, Lipid panel, rosuvastatin (CRESTOR) 10 MG tablet  -With cardiac risk factors as above.  Would like to see LDL below 70 unless other recommendations from lipid specialist, has appointment in May.  Borderline with last LDL 70 last year.  Continue Crestor 10 mg for now and adjust plan accordingly based on lab results.  Prediabetes - Plan: Hemoglobin A1c  -Check A1c, continue healthy eating, exercise.  Elevated coronary artery calcium score - Plan: rosuvastatin (CRESTOR) 10 MG tablet  -As above, continue Crestor  and follow-up with lipid specialist.  Asymptomatic.  Meds ordered this encounter  Medications   amLODipine (NORVASC) 10 MG tablet    Sig: Take 1 tablet (10 mg total) by mouth daily.    Dispense:  90 tablet    Refill:  2   losartan (COZAAR) 50 MG tablet    Sig: Take 1 tablet (50 mg total) by mouth daily.    Dispense:  90 tablet    Refill:  2   rosuvastatin (CRESTOR) 10 MG tablet    Sig: Take 1 tablet (10 mg total) by mouth daily.    Dispense:  90 tablet    Refill:  3   Patient Instructions  Thanks for coming in today.  No medication changes at this time.  Keep follow-up with Dr. Rennis Golden as planned.  If any concerns on labs I will let you know.  Take care!    Signed,   Meredith Staggers, MD [facility], [facility] 06/24/23 8:45 AM

## 2023-06-28 ENCOUNTER — Encounter: Payer: Self-pay | Admitting: Family Medicine

## 2023-10-03 ENCOUNTER — Other Ambulatory Visit (HOSPITAL_BASED_OUTPATIENT_CLINIC_OR_DEPARTMENT_OTHER): Payer: Self-pay | Admitting: Internal Medicine

## 2023-10-04 LAB — NMR, LIPOPROFILE
Cholesterol, Total: 123 mg/dL (ref 100–199)
HDL Particle Number: 32.7 umol/L (ref >=30.5)
HDL-C: 42 mg/dL (ref >39)
LDL Particle Number: 609 nmol/L (ref <1000)
LDL Size: 20.4 nm — ABNORMAL LOW (ref >20.5)
LDL-C (NIH Calc): 55 mg/dL (ref 0–99)
LP-IR Score: 63 — ABNORMAL HIGH (ref <=45)
Small LDL Particle Number: 249 nmol/L (ref <=527)
Triglycerides: 149 mg/dL (ref 0–149)

## 2023-10-07 ENCOUNTER — Encounter (HOSPITAL_BASED_OUTPATIENT_CLINIC_OR_DEPARTMENT_OTHER): Payer: Self-pay | Admitting: Internal Medicine

## 2023-10-07 ENCOUNTER — Ambulatory Visit (INDEPENDENT_AMBULATORY_CARE_PROVIDER_SITE_OTHER): Payer: Managed Care, Other (non HMO) | Admitting: Internal Medicine

## 2023-10-07 VITALS — BP 110/80 | HR 77 | Ht 70.0 in | Wt 172.0 lb

## 2023-10-07 DIAGNOSIS — R931 Abnormal findings on diagnostic imaging of heart and coronary circulation: Secondary | ICD-10-CM | POA: Diagnosis not present

## 2023-10-07 DIAGNOSIS — E7801 Familial hypercholesterolemia: Secondary | ICD-10-CM

## 2023-10-07 DIAGNOSIS — I10 Essential (primary) hypertension: Secondary | ICD-10-CM

## 2023-10-07 DIAGNOSIS — E78019 Familial hypercholesterolemia, unspecified: Secondary | ICD-10-CM

## 2023-10-07 NOTE — Patient Instructions (Signed)
 Medication Instructions:  Your physician recommends that you continue on your current medications as directed. Please refer to the Current Medication list given to you today.  *If you need a refill on your cardiac medications before your next appointment, please call your pharmacy*  Lab Work: Lipid Panel- 1 year   Follow-Up: At [facility], you and your health needs are our priority.  As part of our continuing mission to provide you with exceptional heart care, our providers are all part of one team.  This team includes your primary Cardiologist (physician) and Advanced Practice Providers or APPs (Physician Assistants and Nurse Practitioners) who all work together to provide you with the care you need, when you need it.  Your next appointment:   1 year with Slater Duncan, NP in Lipid Clinic
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
## 2023-12-28 ENCOUNTER — Encounter: Payer: Managed Care, Other (non HMO) | Admitting: Family Medicine

## 2024-01-02 ENCOUNTER — Ambulatory Visit (INDEPENDENT_AMBULATORY_CARE_PROVIDER_SITE_OTHER): Admitting: Family Medicine

## 2024-01-02 ENCOUNTER — Encounter: Payer: Self-pay | Admitting: Family Medicine

## 2024-01-02 VITALS — BP 108/68 | HR 84 | Temp 98.0°F | Ht 70.0 in | Wt 171.0 lb

## 2024-01-02 DIAGNOSIS — I10 Essential (primary) hypertension: Secondary | ICD-10-CM | POA: Diagnosis not present

## 2024-01-02 DIAGNOSIS — R7303 Prediabetes: Secondary | ICD-10-CM | POA: Diagnosis not present

## 2024-01-02 DIAGNOSIS — E782 Mixed hyperlipidemia: Secondary | ICD-10-CM

## 2024-01-02 DIAGNOSIS — R931 Abnormal findings on diagnostic imaging of heart and coronary circulation: Secondary | ICD-10-CM

## 2024-01-02 DIAGNOSIS — Z0000 Encounter for general adult medical examination without abnormal findings: Secondary | ICD-10-CM | POA: Diagnosis not present

## 2024-01-02 DIAGNOSIS — Z23 Encounter for immunization: Secondary | ICD-10-CM

## 2024-01-02 MED ORDER — AMLODIPINE BESYLATE 10 MG PO TABS: 10.0000 mg | ORAL_TABLET | Freq: Every day | ORAL | 2 refills | Status: AC

## 2024-01-02 MED ORDER — LOSARTAN POTASSIUM 50 MG PO TABS
50.0000 mg | ORAL_TABLET | Freq: Every day | ORAL | 2 refills | Status: AC
Start: 2024-01-02 — End: ?

## 2024-01-02 NOTE — Progress Notes (Unsigned)
 Subjective:  Patient ID: Willie [name], male    DOB: 1961/03/04  Age: 63 y.o. MRN: [id number]  CC:  Chief Complaint  Patient presents with   Annual Exam    Physical; no concerns    HPI Willie [name] presents for Annual Exam  PCP, me Lipid specialist, Dr. Mona, familial hyperlipidemia, high coronary artery calcium  score, 99th percentile.  Visit May 2.Asymptomatic, cholesterol well-controlled, no changes to medicines at that time.  1 year follow-up.   Hypertension: Amlodipine  10 mg daily, losartan  50 mg daily without any new side effects. Min swelling at ankles - worse in summer, resolves overnight. No CP/dyspnea.  Home readings: 118/79- 81.  BP Readings from Last 3 Encounters:  01/02/24 108/68  10/07/23 110/80  06/24/23 128/70   Lab Results  Component Value Date   CREATININE 1.11 06/24/2023    Hyperlipidemia: Crestor  10 mg daily, seen by lipid specialist as above. No new myalgias.  Lab Results  Component Value Date   CHOL 139 06/24/2023   HDL 48.40 06/24/2023   LDLCALC 66 06/24/2023   LDLDIRECT 103.0 06/19/2021   TRIG 125.0 06/24/2023   CHOLHDL 3 06/24/2023   Lab Results  Component Value Date   ALT 35 06/24/2023   AST 25 06/24/2023   ALKPHOS 55 06/24/2023   BILITOT 0.5 06/24/2023    GERD Treated with Pepcid as needed with trigger avoidance, still effective. Only needs meds 2x/month   Prediabetes: Diet/exercise approach.  Weight stable.  Active with exercise, hiking, walking, dancing.  Lab Results  Component Value Date   HGBA1C 6.2 06/24/2023   Wt Readings from Last 3 Encounters:  01/02/24 171 lb (77.6 kg)  10/07/23 172 lb (78 kg)  06/24/23 172 lb 9.6 oz (78.3 kg)   Zyrtec  as needed for allergies.      01/02/2024    2:42 PM 06/24/2023    8:07 AM 12/22/2022    8:30 AM 06/21/2022    8:18 AM 12/25/2021    8:37 AM  Depression screen PHQ 2/9  Decreased Interest 0 0 0 0 0  Down, Depressed, Hopeless 0 0 0 0 0  PHQ - 2 Score 0 0 0 0 0  Altered sleeping  0 0 0 0   Tired, decreased energy 0 0 0 0   Change in appetite 0 0 0 0   Feeling bad or failure about yourself  0 0 0 0   Trouble concentrating 0 0 0 0   Moving slowly or fidgety/restless 0 0 0 0   Suicidal thoughts 0 0 0 0   PHQ-9 Score 0 0 0 0   Difficult doing work/chores Not difficult at all        Health Maintenance  Topic Date Due   COVID-19 Vaccine (4 - 2024-25 season) 01/18/2024 (Originally 02/06/2023)   INFLUENZA VACCINE  01/06/2024   DTaP/Tdap/Td (2 - Td or Tdap) 12/13/2027   Colonoscopy  06/20/2028   Hepatitis C Screening  Completed   HIV Screening  Completed   Zoster Vaccines- Shingrix   Completed   Hepatitis B Vaccines  Aged Out   HPV VACCINES  Aged Out   Meningococcal B Vaccine  Aged Out  Colonoscopy 2020 - repeat 10 years.  Prostate: does not have family history of prostate cancer The natural history of prostate cancer and ongoing controversy regarding screening and potential treatment outcomes of prostate cancer has been discussed with the patient. The meaning of a false positive PSA and a false negative PSA has been discussed. He  indicates understanding of the limitations of this screening test and wishes to NOT proceed with screening PSA testing. Lab Results  Component Value Date   PSA1 2.3 06/15/2018     Immunization History  Administered Date(s) Administered   Influenza,inj,Quad PF,6+ Mos 06/18/2019, 06/19/2020   PFIZER(Purple Top)SARS-COV-2 Vaccination 08/29/2019, 09/19/2019, 04/29/2020   Tdap 12/12/2017   Zoster Recombinant(Shingrix ) 06/25/2022, 08/20/2022   Zoster, Live 06/25/2022   Zoster, Unspecified 08/20/2022  Declines flu vaccine, covid booster.  Agrees to prevnar.   No results found. Plans appt soon. No vision changes.   Dental: every 6 months usually - due to reschedule - plans to call.   Alcohol: none  Tobacco: none  Exercise: 4 days per week at least Recent travel to [place].  History Patient Active Problem List   Diagnosis Date  Noted   Adhesive capsulitis of left shoulder 09/13/2020   Recurrent umbilical hernia 06/29/2011   Past Medical History:  Diagnosis Date   Allergy    seasonal   Dyslipidemia    GERD (gastroesophageal reflux disease)    Hypertension    dr joylene   Umbilical hernia    Past Surgical History:  Procedure Laterality Date   HERNIA REPAIR  01/2002   UMBILICAL HERNIA REPAIR  09/01/2011   Procedure: HERNIA REPAIR UMBILICAL ADULT;  Surgeon: Vicenta DELENA Poli, MD;  Location: [facility] OR;  Service: General;  Laterality: N/A;   Allergies  Allergen Reactions   Penicillins Hives, Nausea And Vomiting and Swelling   Prior to Admission medications   Medication Sig Start Date End Date Taking? Authorizing Provider  amLODipine  (NORVASC ) 10 MG tablet Take 1 tablet (10 mg total) by mouth daily. 06/24/23  Yes Levora Reyes SAUNDERS, MD  cetirizine  (ZYRTEC ) 10 MG tablet Take 1 tablet (10 mg total) by mouth daily. 04/07/17  Yes English, Corean D, PA  diphenhydrAMINE (BENADRYL) 25 MG tablet Take 25 mg by mouth as needed.   Yes [provider]  famotidine (PEPCID) 20 MG tablet Take 20 mg by mouth as needed for heartburn or indigestion.   Yes [provider]  losartan  (COZAAR ) 50 MG tablet Take 1 tablet (50 mg total) by mouth daily. 06/24/23  Yes Levora Reyes SAUNDERS, MD  Multiple Vitamins-Minerals (CENTRUM SILVER 50+MEN PO) Take by mouth daily.   Yes [provider]  rosuvastatin  (CRESTOR ) 10 MG tablet Take 1 tablet (10 mg total) by mouth daily. 06/24/23  Yes Levora Reyes SAUNDERS, MD   Social History   Socioeconomic History   Marital status: Married    Spouse name: Not on file   Number of children: Not on file   Years of education: Not on file   Highest education level: Bachelor's degree (e.g., BA, AB, BS)  Occupational History   Not on file  Tobacco Use   Smoking status: Never   Smokeless tobacco: Never  Substance and Sexual Activity   Alcohol use: No   Drug use: No   Sexual activity:  Yes  Other Topics Concern   Not on file  Social History Narrative   Not on file   Social Drivers of Health   Financial Resource Strain: Low Risk  (12/30/2023)   Overall Financial Resource Strain (CARDIA)    Difficulty of Paying Living Expenses: Not very hard  Food Insecurity: No Food Insecurity (12/30/2023)   Hunger Vital Sign    Worried About Running Out of Food in the Last Year: Never true    Ran Out of Food in the Last Year: Never true  Transportation Needs: No Transportation Needs (12/30/2023)   PRAPARE - Administrator, Civil Service (Medical): No    Lack of Transportation (Non-Medical): No  Physical Activity: Sufficiently Active (12/30/2023)   Exercise Vital Sign    Days of Exercise per Week: 5 days    Minutes of Exercise per Session: 30 min  Stress: No Stress Concern Present (12/30/2023)   [employer] of Occupational Health - Occupational Stress Questionnaire    Feeling of Stress: Not at all  Social Connections: Socially Integrated (12/30/2023)   Social Connection and Isolation Panel    Frequency of Communication with Friends and Family: More than three times a week    Frequency of Social Gatherings with Friends and Family: Twice a week    Attends Religious Services: More than 4 times per year    Active Member of [employer] or Organizations: Yes    Attends Banker Meetings: More than 4 times per year    Marital Status: Married  Catering manager Violence: Low Risk  (09/18/2019)   Received from [facility]   Intimate Partner Violence    Insults You: Not on file    Threatens You: Not on file    Screams at You: Not on file    Physically Hurt: Not on file    Intimate Partner Violence Score: Not on file    Review of Systems  13 point review of systems per patient health survey noted.  Negative other than as indicated above or in HPI.   Objective:   Vitals:   01/02/24 1438  BP: 108/68  Pulse: 84  Temp: 98 F (36.7 C)  SpO2: 98%  Weight:  171 lb (77.6 kg)  Height: 5' 10 (1.778 m)   {Vitals History (Optional):23777}  Physical Exam Vitals reviewed.  Constitutional:      Appearance: He is well-developed.  HENT:     Head: Normocephalic and atraumatic.     Right Ear: External ear normal.     Left Ear: External ear normal.  Eyes:     Conjunctiva/sclera: Conjunctivae normal.     Pupils: Pupils are equal, round, and reactive to light.  Neck:     Thyroid: No thyromegaly.  Cardiovascular:     Rate and Rhythm: Normal rate and regular rhythm.     Heart sounds: Normal heart sounds.  Pulmonary:     Effort: Pulmonary effort is normal. No respiratory distress.     Breath sounds: Normal breath sounds. No wheezing.  Abdominal:     General: There is no distension.     Palpations: Abdomen is soft.     Tenderness: There is no abdominal tenderness.  Musculoskeletal:        General: No tenderness. Normal range of motion.     Cervical back: Normal range of motion and neck supple.  Lymphadenopathy:     Cervical: No cervical adenopathy.  Skin:    General: Skin is warm and dry.  Neurological:     Mental Status: He is alert and oriented to person, place, and time.     Deep Tendon Reflexes: Reflexes are normal and symmetric.  Psychiatric:        Behavior: Behavior normal.        Assessment & Plan:  ROMAR WOODRICK is a 63 y.o. male . Need for pneumococcal vaccination - Plan: Pneumococcal conjugate vaccine 20-valent (Prevnar 20)  Annual physical exam  Prediabetes - Plan: Hemoglobin A1c  Mixed hyperlipidemia - Plan: Comprehensive metabolic panel with GFR, Lipid panel  Essential hypertension - Plan: Comprehensive metabolic panel with GFR, amLODipine  (NORVASC ) 10 MG tablet, losartan  (COZAAR ) 50 MG tablet  Elevated coronary artery calcium  score - Plan: Lipid panel   Meds ordered this encounter  Medications   amLODipine  (NORVASC ) 10 MG tablet    Sig: Take 1 tablet (10 mg total) by mouth daily.    Dispense:  90 tablet     Refill:  2   losartan  (COZAAR ) 50 MG tablet    Sig: Take 1 tablet (50 mg total) by mouth daily.    Dispense:  90 tablet    Refill:  2   Patient Instructions   Thanks for coming in today.  If any concerns on labs I will let you know.  No med changes at this time.  Pneumonia vaccine was given today, that should be good for at least 5 years. Follow-up in 6 months but let me know if there are any questions in the meantime.  Preventive Care 105-44 Years Old, Male Preventive care refers to lifestyle choices and visits with your health care provider that can promote health and wellness. Preventive care visits are also called wellness exams. What can I expect for my preventive care visit? Counseling During your preventive care visit, your health care provider may ask about your: Medical history, including: Past medical problems. Family medical history. Current health, including: Emotional well-being. Home life and relationship well-being. Sexual activity. Lifestyle, including: Alcohol, nicotine or tobacco, and drug use. Access to firearms. Diet, exercise, and sleep habits. Safety issues such as seatbelt and bike helmet use. Sunscreen use. Work and work Astronomer. Physical exam Your health care provider will check your: Height and weight. These may be used to calculate your BMI (body mass index). BMI is a measurement that tells if you are at a healthy weight. Waist circumference. This measures the distance around your waistline. This measurement also tells if you are at a healthy weight and may help predict your risk of certain diseases, such as type 2 diabetes and high blood pressure. Heart rate and blood pressure. Body temperature. Skin for abnormal spots. What immunizations do I need?  Vaccines are usually given at various ages, according to a schedule. Your health care provider will recommend vaccines for you based on your age, medical history, and lifestyle or other factors, such  as travel or where you work. What tests do I need? Screening Your health care provider may recommend screening tests for certain conditions. This may include: Lipid and cholesterol levels. Diabetes screening. This is done by checking your blood sugar (glucose) after you have not eaten for a while (fasting). Hepatitis B test. Hepatitis C test. HIV (human immunodeficiency virus) test. STI (sexually transmitted infection) testing, if you are at risk. Lung cancer screening. Prostate cancer screening. Colorectal cancer screening. Talk with your health care provider about your test results, treatment options, and if necessary, the need for more tests. Follow these instructions at home: Eating and drinking  Eat a diet that includes fresh fruits and vegetables, whole grains, lean protein, and low-fat dairy products. Take vitamin and mineral supplements as recommended by your health care provider. Do not drink alcohol if your health care provider tells you not to drink. If you drink alcohol: Limit how much you have to 0-2 drinks a day. Know how much alcohol is in your drink. In the [place] one drink equals one 12 oz bottle of beer (355 mL), one 5 oz glass of wine (148 mL), or one 1 oz glass of  hard liquor (44 mL). Lifestyle Brush your teeth every morning and night with fluoride toothpaste. Floss one time each day. Exercise for at least 30 minutes 5 or more days each week. Do not use any products that contain nicotine or tobacco. These products include cigarettes, chewing tobacco, and vaping devices, such as e-cigarettes. If you need help quitting, ask your health care provider. Do not use drugs. If you are sexually active, practice safe sex. Use a condom or other form of protection to prevent STIs. Take aspirin only as told by your health care provider. Make sure that you understand how much to take and what form to take. Work with your health care provider to find out whether it is safe and  beneficial for you to take aspirin daily. Find healthy ways to manage stress, such as: Meditation, yoga, or listening to music. Journaling. Talking to a trusted person. Spending time with friends and family. Minimize exposure to UV radiation to reduce your risk of skin cancer. Safety Always wear your seat belt while driving or riding in a vehicle. Do not drive: If you have been drinking alcohol. Do not ride with someone who has been drinking. When you are tired or distracted. While texting. If you have been using any mind-altering substances or drugs. Wear a helmet and other protective equipment during sports activities. If you have firearms in your house, make sure you follow all gun safety procedures. What's next? Go to your health care provider once a year for an annual wellness visit. Ask your health care provider how often you should have your eyes and teeth checked. Stay up to date on all vaccines. This information is not intended to replace advice given to you by your health care provider. Make sure you discuss any questions you have with your health care provider. Document Revised: 11/19/2020 Document Reviewed: 11/19/2020 Elsevier Patient Education  2024 Elsevier Inc.    Signed,   Reyes Pines, MD [facility], [facility] 01/02/24 3:20 PM

## 2024-01-02 NOTE — Patient Instructions (Addendum)
 Thanks for coming in today.  If any concerns on labs I will let you know.  No med changes at this time.  Pneumonia vaccine was given today, that should be good for at least 5 years. Follow-up in 6 months but let me know if there are any questions in the meantime.  Preventive Care 32-62 Years Old, Male Preventive care refers to lifestyle choices and visits with your health care provider that can promote health and wellness. Preventive care visits are also called wellness exams. What can I expect for my preventive care visit? Counseling During your preventive care visit, your health care provider may ask about your: Medical history, including: Past medical problems. Family medical history. Current health, including: Emotional well-being. Home life and relationship well-being. Sexual activity. Lifestyle, including: Alcohol, nicotine or tobacco, and drug use. Access to firearms. Diet, exercise, and sleep habits. Safety issues such as seatbelt and bike helmet use. Sunscreen use. Work and work Astronomer. Physical exam Your health care provider will check your: Height and weight. These may be used to calculate your BMI (body mass index). BMI is a measurement that tells if you are at a healthy weight. Waist circumference. This measures the distance around your waistline. This measurement also tells if you are at a healthy weight and may help predict your risk of certain diseases, such as type 2 diabetes and high blood pressure. Heart rate and blood pressure. Body temperature. Skin for abnormal spots. What immunizations do I need?  Vaccines are usually given at various ages, according to a schedule. Your health care provider will recommend vaccines for you based on your age, medical history, and lifestyle or other factors, such as travel or where you work. What tests do I need? Screening Your health care provider may recommend screening tests for certain conditions. This may  include: Lipid and cholesterol levels. Diabetes screening. This is done by checking your blood sugar (glucose) after you have not eaten for a while (fasting). Hepatitis B test. Hepatitis C test. HIV (human immunodeficiency virus) test. STI (sexually transmitted infection) testing, if you are at risk. Lung cancer screening. Prostate cancer screening. Colorectal cancer screening. Talk with your health care provider about your test results, treatment options, and if necessary, the need for more tests. Follow these instructions at home: Eating and drinking  Eat a diet that includes fresh fruits and vegetables, whole grains, lean protein, and low-fat dairy products. Take vitamin and mineral supplements as recommended by your health care provider. Do not drink alcohol if your health care provider tells you not to drink. If you drink alcohol: Limit how much you have to 0-2 drinks a day. Know how much alcohol is in your drink. In the [place] one drink equals one 12 oz bottle of beer (355 mL), one 5 oz glass of wine (148 mL), or one 1 oz glass of hard liquor (44 mL). Lifestyle Brush your teeth every morning and night with fluoride toothpaste. Floss one time each day. Exercise for at least 30 minutes 5 or more days each week. Do not use any products that contain nicotine or tobacco. These products include cigarettes, chewing tobacco, and vaping devices, such as e-cigarettes. If you need help quitting, ask your health care provider. Do not use drugs. If you are sexually active, practice safe sex. Use a condom or other form of protection to prevent STIs. Take aspirin only as told by your health care provider. Make sure that you understand how much to take and what form to  take. Work with your health care provider to find out whether it is safe and beneficial for you to take aspirin daily. Find healthy ways to manage stress, such as: Meditation, yoga, or listening to music. Journaling. Talking to a  trusted person. Spending time with friends and family. Minimize exposure to UV radiation to reduce your risk of skin cancer. Safety Always wear your seat belt while driving or riding in a vehicle. Do not drive: If you have been drinking alcohol. Do not ride with someone who has been drinking. When you are tired or distracted. While texting. If you have been using any mind-altering substances or drugs. Wear a helmet and other protective equipment during sports activities. If you have firearms in your house, make sure you follow all gun safety procedures. What's next? Go to your health care provider once a year for an annual wellness visit. Ask your health care provider how often you should have your eyes and teeth checked. Stay up to date on all vaccines. This information is not intended to replace advice given to you by your health care provider. Make sure you discuss any questions you have with your health care provider. Document Revised: 11/19/2020 Document Reviewed: 11/19/2020 Elsevier Patient Education  2024 [employer].

## 2024-01-03 LAB — COMPREHENSIVE METABOLIC PANEL WITH GFR
ALT: 21 U/L (ref 0–53)
AST: 19 U/L (ref 0–37)
Albumin: 4.5 g/dL (ref 3.5–5.2)
Alkaline Phosphatase: 58 U/L (ref 39–117)
BUN: 13 mg/dL (ref 6–23)
CO2: 28 meq/L (ref 19–32)
Calcium: 9.4 mg/dL (ref 8.4–10.5)
Chloride: 103 meq/L (ref 96–112)
Creatinine, Ser: 1.13 mg/dL (ref 0.40–1.50)
GFR: 69.88 mL/min (ref >60.00)
Glucose, Bld: 91 mg/dL (ref 70–99)
Potassium: 4.5 meq/L (ref 3.5–5.1)
Sodium: 140 meq/L (ref 135–145)
Total Bilirubin: 0.5 mg/dL (ref 0.2–1.2)
Total Protein: 7.3 g/dL (ref 6.0–8.3)

## 2024-01-03 LAB — LIPID PANEL
Cholesterol: 125 mg/dL (ref 0–200)
HDL: 43.3 mg/dL (ref >39.00)
LDL Cholesterol: 56 mg/dL (ref 0–99)
NonHDL: 81.74
Total CHOL/HDL Ratio: 3
Triglycerides: 130 mg/dL (ref 0.0–149.0)
VLDL: 26 mg/dL (ref 0.0–40.0)

## 2024-01-03 LAB — HEMOGLOBIN A1C: Hgb A1c MFr Bld: 6.1 % (ref 4.6–6.5)

## 2024-01-04 ENCOUNTER — Ambulatory Visit: Payer: Self-pay | Admitting: Family Medicine

## 2024-06-11 ENCOUNTER — Ambulatory Visit (HOSPITAL_COMMUNITY)
Admission: EM | Admit: 2024-06-11 | Discharge: 2024-06-11 | Disposition: A | Discharge status: Home / Self Care | Attending: Family Medicine | Admitting: Family Medicine

## 2024-06-11 ENCOUNTER — Encounter (HOSPITAL_COMMUNITY): Payer: Self-pay

## 2024-06-11 DIAGNOSIS — R10A1 Flank pain, right side: Secondary | ICD-10-CM | POA: Insufficient documentation

## 2024-06-11 DIAGNOSIS — N200 Calculus of kidney: Secondary | ICD-10-CM | POA: Insufficient documentation

## 2024-06-11 LAB — POCT URINE DIPSTICK
Glucose, UA: NEGATIVE mg/dL
Leukocytes, UA: NEGATIVE
Nitrite, UA: NEGATIVE
POC PROTEIN,UA: 30 — AB
Spec Grav, UA: 1.025
Urobilinogen, UA: 0.2 U/dL
pH, UA: 6

## 2024-06-11 MED ORDER — KETOROLAC TROMETHAMINE 30 MG/ML IJ SOLN
30.0000 mg | Freq: Once | INTRAMUSCULAR | Status: AC
  Administered 2024-06-11: 30 mg via INTRAMUSCULAR

## 2024-06-11 MED ORDER — KETOROLAC TROMETHAMINE 10 MG PO TABS: 10.0000 mg | ORAL_TABLET | Freq: Four times a day (QID) | ORAL | 0 refills | Status: DC | PRN

## 2024-06-11 MED ORDER — ONDANSETRON 4 MG PO TBDP: 4.0000 mg | ORAL_TABLET | Freq: Three times a day (TID) | ORAL | 0 refills | Status: DC | PRN

## 2024-06-11 MED ORDER — KETOROLAC TROMETHAMINE 30 MG/ML IJ SOLN
INTRAMUSCULAR | Status: AC
  Filled 2024-06-11: qty 1

## 2024-06-11 MED ORDER — TAMSULOSIN HCL 0.4 MG PO CAPS: 0.4000 mg | ORAL_CAPSULE | Freq: Every day | ORAL | 0 refills | Status: DC

## 2024-06-11 NOTE — ED Triage Notes (Signed)
 Patient here today with c/o low back pain that radiates around to abdomen and has some frequent urge to urinate but not able to go. Symptoms started at 1 pm today.

## 2024-06-11 NOTE — ED Provider Notes (Addendum)
 " [facility]    CSN: [id number] Arrival date & time: 06/11/24  1600      History   Chief Complaint Chief Complaint  Patient presents with   Back Pain    HPI Willie [name] is a 63 y.o. male.    Back Pain  Here for pain in his right upper lumbar area that radiates around to his right mid abdomen.  He also has had urinary frequency and urgency.  No dysuria.  No fever or chills.  He has had some nausea but no vomiting. The pain began at 115 or 130 this afternoon.  Last bowel movement was this morning though it was a little strained.    He has never had anything like this before.  He is allergic to penicillin  His current medications include amlodipine , losartan , and rosuvastatin   Last eGFR is 69.9   Past Medical History:  Diagnosis Date   Allergy    seasonal   Dyslipidemia    GERD (gastroesophageal reflux disease)    Hypertension    dr joylene   Umbilical hernia     Patient Active Problem List   Diagnosis Date Noted   Adhesive capsulitis of left shoulder 09/13/2020   Recurrent umbilical hernia 06/29/2011    Past Surgical History:  Procedure Laterality Date   HERNIA REPAIR  01/2002   UMBILICAL HERNIA REPAIR  09/01/2011   Procedure: HERNIA REPAIR UMBILICAL ADULT;  Surgeon: Vicenta DELENA Poli, MD;  Location: [facility] OR;  Service: General;  Laterality: N/A;       Home Medications    Prior to Admission medications  Medication Sig Start Date End Date Taking? Authorizing Provider  ketorolac  (TORADOL ) 10 MG tablet Take 1 tablet (10 mg total) by mouth every 6 (six) hours as needed (pain). 06/11/24  Yes Vonna Sharlet POUR, MD  ondansetron  (ZOFRAN -ODT) 4 MG disintegrating tablet Take 1 tablet (4 mg total) by mouth every 8 (eight) hours as needed for nausea or vomiting. 06/11/24  Yes Vonna Sharlet POUR, MD  tamsulosin  (FLOMAX ) 0.4 MG CAPS capsule Take 1 capsule (0.4 mg total) by mouth daily. 06/11/24  Yes Vonna Sharlet POUR, MD  amLODipine  (NORVASC ) 10 MG tablet  Take 1 tablet (10 mg total) by mouth daily. 01/02/24   Levora Reyes SAUNDERS, MD  cetirizine  (ZYRTEC ) 10 MG tablet Take 1 tablet (10 mg total) by mouth daily. 04/07/17   Isadora Krabbe D, PA  diphenhydrAMINE (BENADRYL) 25 MG tablet Take 25 mg by mouth as needed.    [provider]  famotidine (PEPCID) 20 MG tablet Take 20 mg by mouth as needed for heartburn or indigestion.    [provider]  losartan  (COZAAR ) 50 MG tablet Take 1 tablet (50 mg total) by mouth daily. 01/02/24   Levora Reyes SAUNDERS, MD  Multiple Vitamins-Minerals (CENTRUM SILVER 50+MEN PO) Take by mouth daily.    [provider]  rosuvastatin  (CRESTOR ) 10 MG tablet Take 1 tablet (10 mg total) by mouth daily. 06/24/23   Levora Reyes SAUNDERS, MD    Family History Family History  Problem Relation Age of Onset   Cancer Father        lung   Pneumonia Father    Lung cancer Father    Hypertension Mother    Heart disease Mother    Seizures Mother    Healthy Sister    Healthy Brother    Cancer Maternal Grandmother    Heart disease Paternal Grandfather    Healthy Brother  Social History Social History[1]   Allergies   Penicillins   Review of Systems Review of Systems  Musculoskeletal:  Positive for back pain.     Physical Exam Triage Vital Signs ED Triage Vitals  Encounter Vitals Group     BP 06/11/24 1819 107/70     Girls Systolic BP Percentile --      Girls Diastolic BP Percentile --      Boys Systolic BP Percentile --      Boys Diastolic BP Percentile --      Pulse Rate 06/11/24 1819 81     Resp 06/11/24 1819 16     Temp 06/11/24 1819 98.2 F (36.8 C)     Temp Source 06/11/24 1819 Oral     SpO2 06/11/24 1819 97 %     Weight --      Height --      Head Circumference --      Peak Flow --      Pain Score 06/11/24 1818 10     Pain Loc --      Pain Education --      Exclude from Growth Chart --    No data found.  Updated Vital Signs BP 107/70 (BP Location: Left Arm)   Pulse  81   Temp 98.2 F (36.8 C) (Oral)   Resp 16   SpO2 97%   Visual Acuity Right Eye Distance:   Left Eye Distance:   Bilateral Distance:    Right Eye Near:   Left Eye Near:    Bilateral Near:     Physical Exam Vitals reviewed.  Constitutional:      Appearance: He is not toxic-appearing or diaphoretic.     Comments: He is in obvious discomfort and is having trouble getting comfortable.  He stands some during the exam and sits some during the exam.  HENT:     Nose: Nose normal.     Mouth/Throat:     Mouth: Mucous membranes are moist.  Eyes:     Extraocular Movements: Extraocular movements intact.     Conjunctiva/sclera: Conjunctivae normal.     Pupils: Pupils are equal, round, and reactive to light.  Cardiovascular:     Rate and Rhythm: Normal rate and regular rhythm.     Heart sounds: No murmur heard. Pulmonary:     Effort: Pulmonary effort is normal.     Breath sounds: Normal breath sounds.  Abdominal:     General: There is no distension.     Palpations: Abdomen is soft.     Tenderness: There is no abdominal tenderness. There is right CVA tenderness. There is no left CVA tenderness.  Musculoskeletal:     Cervical back: Neck supple.  Lymphadenopathy:     Cervical: No cervical adenopathy.  Skin:    Coloration: Skin is not pale.  Neurological:     General: No focal deficit present.     Mental Status: He is alert and oriented to person, place, and time.  Psychiatric:        Behavior: Behavior normal.      UC Treatments / Results  Labs (all labs ordered are listed, but only abnormal results are displayed) Labs Reviewed  POCT URINE DIPSTICK - Abnormal; Notable for the following components:      Result Value   Bilirubin, UA small (*)    Ketones, POC UA trace (5) (*)    Blood, UA moderate (*)    POC PROTEIN,UA =30 (*)    All other  components within normal limits  URINE CULTURE    EKG   Radiology No results found.  Procedures Procedures (including  critical care time)  Medications Ordered in UC Medications  ketorolac  (TORADOL ) 30 MG/ML injection 30 mg (has no administration in time range)    Initial Impression / Assessment and Plan / UC Course  I have reviewed the triage vital signs and the nursing notes.  Pertinent labs & imaging results that were available during my care of the patient were reviewed by me and considered in my medical decision making (see chart for details).     There is a moderate amount of blood on the urinalysis.  Culture is sent and if there is any sign of UTI, staff will call him  Toradol  injection is given here and Toradol  tablets are sent to the pharmacy short-term use for what appears to be a kidney stone. Flomax  is sent in to help urinary flow.  Zofran  is sent in for his nausea  He is given contact information for urology. I have asked him to follow-up with his primary care  He is given warnings to go to the emergency room if he does not get any relief from the treatments provided, he worsens anyway, or if he cannot urinate Final Clinical Impressions(s) / UC Diagnoses   Final diagnoses:  Right flank pain  Renal stone     Discharge Instructions      The urinalysis has a moderate amount of blood on it, consistent with urinary stone.  You have been given a shot of Toradol  30 mg today.  Ketorolac  10 mg tablets--take 1 tablet every 6 hours as needed for pain.  This is the same medicine that is in the shot we just gave you  Tamsulosin  0.4 mg--1 daily.  This is to help urinary flow  Ondansetron  dissolved in the mouth every 8 hours as needed for nausea or vomiting.  Increase your fluid intake.  Urine culture is sent, and staff will notify you that it looks like you need an antibiotic for a urinary infection.  Please go to the emergency room if you do not get any relief from the treatments provided, or if you worsen in any way, including more intense pain or inability to urinate.  Please  follow-up with primary care       ED Prescriptions     Medication Sig Dispense Auth. Provider   ketorolac  (TORADOL ) 10 MG tablet Take 1 tablet (10 mg total) by mouth every 6 (six) hours as needed (pain). 20 tablet Vonna Sharlet POUR, MD   tamsulosin  (FLOMAX ) 0.4 MG CAPS capsule Take 1 capsule (0.4 mg total) by mouth daily. 10 capsule Vonna Sharlet POUR, MD   ondansetron  (ZOFRAN -ODT) 4 MG disintegrating tablet Take 1 tablet (4 mg total) by mouth every 8 (eight) hours as needed for nausea or vomiting. 10 tablet Vonna Nixon Sparr K, MD      PDMP not reviewed this encounter.    Vonna Sharlet POUR, MD 06/11/24 1907     [1]  Social History Tobacco Use   Smoking status: Never   Smokeless tobacco: Never  Substance Use Topics   Alcohol use: No   Drug use: No     Vonna Sharlet POUR, MD 06/11/24 1908  "

## 2024-06-11 NOTE — Discharge Instructions (Addendum)
 The urinalysis has a moderate amount of blood on it, consistent with urinary stone.  You have been given a shot of Toradol  30 mg today.  Ketorolac  10 mg tablets--take 1 tablet every 6 hours as needed for pain.  This is the same medicine that is in the shot we just gave you  Tamsulosin  0.4 mg--1 daily.  This is to help urinary flow  Ondansetron  dissolved in the mouth every 8 hours as needed for nausea or vomiting.  Increase your fluid intake.  Urine culture is sent, and staff will notify you that it looks like you need an antibiotic for a urinary infection.  Please go to the emergency room if you do not get any relief from the treatments provided, or if you worsen in any way, including more intense pain or inability to urinate.  Please follow-up with primary care

## 2024-06-12 LAB — URINE CULTURE: Culture: NO GROWTH

## 2024-06-13 ENCOUNTER — Ambulatory Visit: Payer: Self-pay

## 2024-06-13 ENCOUNTER — Ambulatory Visit: Admitting: Urology

## 2024-06-13 ENCOUNTER — Encounter: Payer: Self-pay | Admitting: Urology

## 2024-06-13 ENCOUNTER — Ambulatory Visit

## 2024-06-13 VITALS — BP 132/78 | HR 100 | Ht 70.0 in | Wt 168.0 lb

## 2024-06-13 DIAGNOSIS — N201 Calculus of ureter: Secondary | ICD-10-CM | POA: Diagnosis not present

## 2024-06-13 DIAGNOSIS — R10A1 Flank pain, right side: Secondary | ICD-10-CM

## 2024-06-13 LAB — MICROSCOPIC EXAMINATION

## 2024-06-13 LAB — URINALYSIS, ROUTINE W REFLEX MICROSCOPIC
Glucose, UA: NEGATIVE
Leukocytes,UA: NEGATIVE
Nitrite, UA: NEGATIVE
Protein,UA: NEGATIVE
Urobilinogen, Ur: 0.2 mg/dL (ref 0.2–1.0)
pH, UA: 6 (ref 5.0–7.5)

## 2024-06-13 MED ORDER — HYDROCODONE-ACETAMINOPHEN 5-325 MG PO TABS: 1.0000 | ORAL_TABLET | Freq: Four times a day (QID) | ORAL | 0 refills | Status: DC | PRN

## 2024-06-13 NOTE — Progress Notes (Signed)
 " Assessment: 1. Ureteral calculus, right, distal, 2 mm   2. Right flank pain     Plan: I personally reviewed the patient's chart including provider notes, lab results. I personally reviewed the CT study from today which shows a 2 mm calculus in the distal right ureter with associated obstruction. Results discussed with the patient. Diagnosis and treatment options for a ureteral calculus including spontaneous stone passage and ureteroscopic stone manipulation were discussed with Willie [name] Seeds in detail. He has elected to attempt to pass the ureteral calculus. Strain urine, Pain medication as needed.  Rx provided., Anti-emetics as needed.  Rx provided, and Alpha blocker for medical expulsive therapy.  Off label use discussed.  Use and side effects reviewed.  Rx provided. Follow-up in 1 weeks Return to office or ER for uncontrolled pain, vomiting, fever, or chills.   Chief Complaint:  Chief Complaint  Patient presents with   Flank Pain    History of Present Illness:  Willie [name] is a 64 y.o. male who is seen for evaluation of right flank pain. He presented to urgent care on 06/11/2024 with pain in the right flank with radiation to the right mid abdomen.  He also had some associated nausea without vomiting.  No history of kidney stones. Dipstick urinalysis showed moderate blood.  No imaging performed. He was given pain medication and tamsulosin .  He has continued to have pain in the right flank and back area.  He reports this as a 7/10.  No fevers.  He is having some subjective chills.  He has not had any significant lower urinary tract symptoms.  No dysuria or gross hematuria.   Past Medical History:  Past Medical History:  Diagnosis Date   Allergy    seasonal   Dyslipidemia    GERD (gastroesophageal reflux disease)    Hypertension    dr joylene   Umbilical hernia     Past Surgical History:  Past Surgical History:  Procedure Laterality Date   HERNIA REPAIR  01/2002    UMBILICAL HERNIA REPAIR  09/01/2011   Procedure: HERNIA REPAIR UMBILICAL ADULT;  Surgeon: Vicenta DELENA Poli, MD;  Location: [facility];  Service: General;  Laterality: N/A;    Allergies:  Allergies[1]  Family History:  Family History  Problem Relation Age of Onset   Cancer Father        lung   Pneumonia Father    Lung cancer Father    Hypertension Mother    Heart disease Mother    Seizures Mother    Healthy Sister    Healthy Brother    Cancer Maternal Grandmother    Heart disease Paternal Grandfather    Healthy Brother     Social History:  Social History[2]  Review of symptoms:  Constitutional:  Negative for unexplained weight loss, night sweats, fever, chills ENT:  Negative for nose bleeds, sinus pain, painful swallowing CV:  Negative for chest pain, shortness of breath, exercise intolerance, palpitations, loss of consciousness Resp:  Negative for cough, wheezing, shortness of breath GI:  Negative for vomiting, diarrhea, bloody stools GU:  Positives noted in HPI; otherwise negative for gross hematuria, dysuria, urinary incontinence Neuro:  Negative for seizures, poor balance, limb weakness, slurred speech Psych:  Negative for lack of energy, depression, anxiety Endocrine:  Negative for polydipsia, polyuria, symptoms of hypoglycemia (dizziness, hunger, sweating) Hematologic:  Negative for anemia, purpura, petechia, prolonged or excessive bleeding, use of anticoagulants  Allergic:  Negative for difficulty breathing or choking as a result  of exposure to anything; no shellfish allergy; no allergic response (rash/itch) to materials, foods  Physical exam: BP 132/78   Pulse 100   Ht 5' 10 (1.778 m)   Wt 168 lb (76.2 kg)   BMI 24.11 kg/m  GENERAL APPEARANCE:  Well appearing, well developed, well nourished, NAD HEENT: Atraumatic, Normocephalic, oropharynx clear. NECK: Supple without lymphadenopathy or thyromegaly. LUNGS: Clear to auscultation bilaterally. HEART: Regular Rate  and Rhythm without murmurs, gallops, or rubs. ABDOMEN: Soft, non-tender, No Masses. EXTREMITIES: Moves all extremities well.  Without clubbing, cyanosis, or edema. NEUROLOGIC:  Alert and oriented x 3, normal gait, CN II-XII grossly intact.  MENTAL STATUS:  Appropriate. BACK: tender to palpation in right paraspinal area and right flank SKIN:  Warm, dry and intact.    Results: U/A: 0-5 WBCs, 0-2 RBCs  CT RENAL STONE STUDY Result Date: 06/13/2024 EXAM: CT ABDOMEN AND PELVIS WITHOUT CONTRAST 06/13/2024 10:48:42 AM TECHNIQUE: CT of the abdomen and pelvis was performed without the administration of intravenous contrast. Multiplanar reformatted images are provided for review. Automated exposure control, iterative reconstruction, and/or weight-based adjustment of the mA/kV was utilized to reduce the radiation dose to as low as reasonably achievable. COMPARISON: None available. CLINICAL HISTORY: right flank pain FINDINGS: LOWER CHEST: Subsegmental atelectasis in the anterior right lower lobe. 5 mm medial basal right lower lobe nodule. Dense multivessel coronary atherosclerosis. LIVER: The liver is unremarkable. GALLBLADDER AND BILE DUCTS: Gallbladder is unremarkable. No biliary ductal dilatation. SPLEEN: No acute abnormality. PANCREAS: No acute abnormality. ADRENAL GLANDS: No acute abnormality. KIDNEYS, URETERS AND BLADDER: Multiple bilateral renal cysts, the largest is on the left, measuring 3.8 cm. Multiple peripelvic cysts are also present in both kidneys. There is mild right sided hydroureteronephrosis due to a punctate 2 mm calculus in the distal right ureter. Moderate right perinephric stranding. Urinary bladder is unremarkable. Per consensus, no follow-up is needed for simple Bosniak type 1 and 2 renal cysts, unless the patient has a malignancy history or risk factors. GI AND BOWEL: Stomach demonstrates no acute abnormality. Gas filled nondistended normal appendix. There is no bowel obstruction. PERITONEUM  AND RETROPERITONEUM: Small amount of layering fluid and stranding in the right pelvis and the presacral brace, likely related to the extensive perinephric stranding. No free air. VASCULATURE: Aorta is normal in caliber. Scattered aortoiliac atherosclerosis. LYMPH NODES: No lymphadenopathy. REPRODUCTIVE ORGANS: No acute abnormality. BONES AND SOFT TISSUES: Mild multilevel degenerative disc disease of the visualized spine. Small fat containing left inguinal hernia. Partially visualized left sided gynecomastia. No acute osseous abnormality. No focal soft tissue abnormality. IMPRESSION: 1. Mild right-sided hydroureteronephrosis due to a punctate 2 mm calculus in the distal right ureter. Electronically signed by: Rogelia Myers MD 06/13/2024 11:15 AM EST RP Workstation: [id number]        [1]  Allergies Allergen Reactions   Penicillins Hives, Nausea And Vomiting and Swelling  [2]  Social History Tobacco Use   Smoking status: Never   Smokeless tobacco: Never  Substance Use Topics   Alcohol use: No   Drug use: No   "

## 2024-06-14 ENCOUNTER — Ambulatory Visit (INDEPENDENT_AMBULATORY_CARE_PROVIDER_SITE_OTHER): Admitting: Family Medicine

## 2024-06-14 ENCOUNTER — Encounter: Payer: Self-pay | Admitting: Family Medicine

## 2024-06-14 VITALS — BP 118/62 | HR 91 | Temp 98.4°F | Resp 16 | Ht 70.0 in | Wt 170.0 lb

## 2024-06-14 DIAGNOSIS — N201 Calculus of ureter: Secondary | ICD-10-CM | POA: Diagnosis not present

## 2024-06-14 NOTE — Progress Notes (Signed)
 "  Subjective:  Patient ID: Willie [name], male    DOB: 14-Feb-1961  Age: 64 y.o. MRN: [id number]  CC:  Chief Complaint  Patient presents with   Acute Visit    urgent care monday night. 2mm kidney stone. Had imaging done yesterday, CT scan. Patient is uncomfortable. Was given stronger pain medication. Urine output is okay. No blood in urine that he can see. Saw urology yesterday.     HPI Willie [name] presents for   Urgent care follow-up for nephrolithiasis  Nephrolithiasis He was seen 3 days ago at urgent care for right flank pain, with associated urinary frequency and urgency but no dysuria.  Nausea without vomiting.  Symptoms started that afternoon.  Moderate blood on urinalysis.  Culture sent - no growth.   Toradol  injection given for suspected nephrolithiasis.  Flomax  started to help urinary flow and Zofran  for nausea.  Plan for urology follow-up.  He was seen by urology, Dr. Roseann yesterday.  CT renal stone study with mild right-sided hydroureteronephrosis due to a punctate 2 mm calculus in the right distal ureter.  Initially elected to try to allow for spontaneous stone passage.  Pain medication provided with antiemetics as needed, alpha-blocker for medical expulsive therapy and recheck planned in 1 week. Has rx for hydrocodone  since yesterday - taking every 6 hours as needed - managing pain better. Has not seen stone passed yet, but he does have a screen to check as of today.  No fever. No gross hematuria.  Has not needed zofran  since initial night.  Drinking fluids, able to urinate.  Working from home.   History Patient Active Problem List   Diagnosis Date Noted   Adhesive capsulitis of left shoulder 09/13/2020   Recurrent umbilical hernia 06/29/2011   Past Medical History:  Diagnosis Date   Allergy    seasonal   Dyslipidemia    GERD (gastroesophageal reflux disease)    Hypertension    dr joylene   Umbilical hernia    Past Surgical History:  Procedure Laterality  Date   HERNIA REPAIR  01/2002   UMBILICAL HERNIA REPAIR  09/01/2011   Procedure: HERNIA REPAIR UMBILICAL ADULT;  Surgeon: Vicenta DELENA Poli, MD;  Location: [facility] OR;  Service: General;  Laterality: N/A;   Allergies[1] Prior to Admission medications  Medication Sig Start Date End Date Taking? Authorizing Provider  amLODipine  (NORVASC ) 10 MG tablet Take 1 tablet (10 mg total) by mouth daily. 01/02/24  Yes Levora Willie SAUNDERS, MD  cetirizine  (ZYRTEC ) 10 MG tablet Take 1 tablet (10 mg total) by mouth daily. 04/07/17  Yes English, Corean D, PA  diphenhydrAMINE (BENADRYL) 25 MG tablet Take 25 mg by mouth as needed.   Yes [provider]  famotidine (PEPCID) 20 MG tablet Take 20 mg by mouth as needed for heartburn or indigestion.   Yes [provider]  HYDROcodone -acetaminophen  (NORCO/VICODIN) 5-325 MG tablet Take 1 tablet by mouth every 6 (six) hours as needed. 06/13/24  Yes Stoneking, Adine PARAS., MD  ketorolac  (TORADOL ) 10 MG tablet Take 1 tablet (10 mg total) by mouth every 6 (six) hours as needed (pain). 06/11/24  Yes Vonna Sharlet POUR, MD  losartan  (COZAAR ) 50 MG tablet Take 1 tablet (50 mg total) by mouth daily. 01/02/24  Yes Levora Willie SAUNDERS, MD  Multiple Vitamins-Minerals (CENTRUM SILVER 50+MEN PO) Take by mouth daily.   Yes [provider]  ondansetron  (ZOFRAN -ODT) 4 MG disintegrating tablet Take 1 tablet (4 mg total) by mouth every 8 (eight) hours  as needed for nausea or vomiting. 06/11/24  Yes Vonna Sharlet POUR, MD  rosuvastatin  (CRESTOR ) 10 MG tablet Take 1 tablet (10 mg total) by mouth daily. 06/24/23  Yes Levora Willie SAUNDERS, MD  tamsulosin  (FLOMAX ) 0.4 MG CAPS capsule Take 1 capsule (0.4 mg total) by mouth daily. 06/11/24  Yes Vonna Sharlet POUR, MD   Social History   Socioeconomic History   Marital status: Married    Spouse name: Not on file   Number of children: Not on file   Years of education: Not on file   Highest education level: Bachelor's degree (e.g., BA, AB,  BS)  Occupational History   Not on file  Tobacco Use   Smoking status: Never   Smokeless tobacco: Never  Substance and Sexual Activity   Alcohol use: Never   Drug use: Never   Sexual activity: Yes    Birth control/protection: Condom  Other Topics Concern   Not on file  Social History Narrative   Not on file   Social Drivers of Health   Tobacco Use: Low Risk (06/14/2024)   Patient History    Smoking Tobacco Use: Never    Smokeless Tobacco Use: Never    Passive Exposure: Not on file  Financial Resource Strain: Low Risk (12/30/2023)   Overall Financial Resource Strain (CARDIA)    Difficulty of Paying Living Expenses: Not very hard  Food Insecurity: No Food Insecurity (12/30/2023)   Epic    Worried About Radiation Protection Practitioner of Food in the Last Year: Never true    Ran Out of Food in the Last Year: Never true  Transportation Needs: No Transportation Needs (12/30/2023)   Epic    Lack of Transportation (Medical): No    Lack of Transportation (Non-Medical): No  Physical Activity: Sufficiently Active (12/30/2023)   Exercise Vital Sign    Days of Exercise per Week: 5 days    Minutes of Exercise per Session: 30 min  Stress: No Stress Concern Present (12/30/2023)   [employer] of Occupational Health - Occupational Stress Questionnaire    Feeling of Stress: Not at all  Social Connections: Socially Integrated (12/30/2023)   Social Connection and Isolation Panel    Frequency of Communication with Friends and Family: More than three times a week    Frequency of Social Gatherings with Friends and Family: Twice a week    Attends Religious Services: More than 4 times per year    Active Member of [employer] or Organizations: Yes    Attends Banker Meetings: More than 4 times per year    Marital Status: Married  Catering Manager Violence: Not on file  Depression (PHQ2-9): Low Risk (01/02/2024)   Depression (PHQ2-9)    PHQ-2 Score: 0  Alcohol Screen: Not on file  Housing: Low Risk  (12/30/2023)   Epic    Unable to Pay for Housing in the Last Year: No    Number of Times Moved in the Last Year: 0    Homeless in the Last Year: No  Utilities: Not on file  Health Literacy: Not on file    Review of Systems   Objective:   Vitals:   06/14/24 1127  BP: 118/62  Pulse: 91  Resp: 16  Temp: 98.4 F (36.9 C)  TempSrc: Temporal  SpO2: 98%  Weight: 170 lb (77.1 kg)  Height: 5' 10 (1.778 m)     Physical Exam Vitals reviewed.  Constitutional:      Appearance: He is well-developed.  HENT:  Head: Normocephalic and atraumatic.  Neck:     Vascular: No carotid bruit or JVD.  Cardiovascular:     Rate and Rhythm: Normal rate and regular rhythm.     Heart sounds: Normal heart sounds. No murmur heard. Pulmonary:     Effort: Pulmonary effort is normal.     Breath sounds: Normal breath sounds. No rales.  Abdominal:     Tenderness: There is abdominal tenderness (Minimal discomfort right lower quadrant and with CVA percussion.  No rebound or guarding).  Musculoskeletal:     Right lower leg: No edema.     Left lower leg: No edema.  Skin:    General: Skin is warm and dry.  Neurological:     Mental Status: He is alert and oriented to person, place, and time.  Psychiatric:        Mood and Affect: Mood normal.        Assessment & Plan:  Willie [name] is a 64 y.o. male . Right ureteral calculus Now improved pain control, tolerating current med regimen.  Drinking fluids, stable.  Continue to monitor for expulsion of stone on consent and follow-up with urology in 1 week if not improved.  RTC/ER precautions given.  No orders of the defined types were placed in this encounter.  Patient Instructions  Sorry to hear about the kidney stone but it appears that you are on appropriate treatment and glad to hear that you have a better pain medication from your visit yesterday to help manage the pain.  Keep us  posted and let me know if I can help in the meantime.  I am  hopeful that stone will pass soon.  Follow-up with urology as planned.  Hang in there!    Signed,   Willie Pines, MD [facility], [facility] 06/14/2024 12:29 PM      [1]  Allergies Allergen Reactions   Penicillins Hives, Nausea And Vomiting and Swelling   "

## 2024-06-14 NOTE — Patient Instructions (Signed)
 Sorry to hear about the kidney stone but it appears that you are on appropriate treatment and glad to hear that you have a better pain medication from your visit yesterday to help manage the pain.  Keep us  posted and let me know if I can help in the meantime.  I am hopeful that stone will pass soon.  Follow-up with urology as planned.  Hang in there!

## 2024-06-22 ENCOUNTER — Encounter: Payer: Self-pay | Admitting: Urology

## 2024-06-22 ENCOUNTER — Ambulatory Visit (INDEPENDENT_AMBULATORY_CARE_PROVIDER_SITE_OTHER): Admitting: Urology

## 2024-06-22 VITALS — BP 136/83 | HR 90

## 2024-06-22 DIAGNOSIS — N201 Calculus of ureter: Secondary | ICD-10-CM | POA: Diagnosis not present

## 2024-06-22 DIAGNOSIS — R10A1 Flank pain, right side: Secondary | ICD-10-CM

## 2024-06-22 LAB — URINALYSIS, ROUTINE W REFLEX MICROSCOPIC
Bilirubin, UA: NEGATIVE
Glucose, UA: NEGATIVE
Ketones, UA: NEGATIVE
Leukocytes,UA: NEGATIVE
Nitrite, UA: NEGATIVE
Protein,UA: NEGATIVE
Specific Gravity, UA: 1.01 (ref 1.005–1.030)
Urobilinogen, Ur: 1 mg/dL (ref 0.2–1.0)
pH, UA: 7 (ref 5.0–7.5)

## 2024-06-22 LAB — MICROSCOPIC EXAMINATION: Bacteria, UA: NONE SEEN

## 2024-06-22 MED ORDER — HYDROCODONE-ACETAMINOPHEN 5-325 MG PO TABS: 1.0000 | ORAL_TABLET | Freq: Four times a day (QID) | ORAL | 0 refills | Status: DC | PRN

## 2024-06-22 MED ORDER — TAMSULOSIN HCL 0.4 MG PO CAPS: 0.4000 mg | ORAL_CAPSULE | Freq: Every day | ORAL | 1 refills | Status: DC

## 2024-06-22 NOTE — Progress Notes (Signed)
 " Assessment: 1. Ureteral calculus; right, distal, 2 mm   2. Right flank pain      Plan: Diagnosis and treatment options for a ureteral calculus including spontaneous stone passage and ureteroscopic stone manipulation were discussed with Willie [name] in detail. He has elected to attempt to pass the ureteral calculus. Strain urine, Pain medication as needed.  Rx provided., Anti-emetics as needed.  Rx provided, and Alpha blocker for medical expulsive therapy.  Off label use discussed.  Use and side effects reviewed.  Rx provided. Follow-up in 2 weeks Return to office or ER for uncontrolled pain, vomiting, fever, or chills.   Chief Complaint:  Chief Complaint  Patient presents with   Nephrolithiasis    History of Present Illness:  Willie [name] is a 64 y.o. male who is seen for further evaluation of right ureteral calculus. He presented to urgent care on 06/11/2024 with pain in the right flank with radiation to the right mid abdomen.  He also had some associated nausea without vomiting.  No history of kidney stones. Dipstick urinalysis showed moderate blood.  No imaging performed. He was given pain medication and tamsulosin .  He continued to have pain in the right flank and back area with severity of 7/10.  No fevers.  He has not had any significant lower urinary tract symptoms.  No dysuria or gross hematuria. CT renal stone study from 06/13/2024 showed a 2 mm calculus in the right distal ureter with mild right sided hydronephrosis. He continued on tamsulosin  and pain medication as needed. He has continued to have some intermittent discomfort in the right flank area.  He has taken oxycodone once or twice a day for management.  He has not taken any pain medication today.  He has been straining his urine but is not aware of passing a stone.  He is not having any dysuria or gross hematuria.  Portions of the above documentation were copied from a prior visit for review purposes only.  Past  Medical History:  Past Medical History:  Diagnosis Date   Allergy    seasonal   Dyslipidemia    GERD (gastroesophageal reflux disease)    Hypertension    dr joylene   Umbilical hernia     Past Surgical History:  Past Surgical History:  Procedure Laterality Date   HERNIA REPAIR  01/2002   UMBILICAL HERNIA REPAIR  09/01/2011   Procedure: HERNIA REPAIR UMBILICAL ADULT;  Surgeon: Vicenta DELENA Poli, MD;  Location: [facility] OR;  Service: General;  Laterality: N/A;    Allergies:  Allergies[1]  Family History:  Family History  Problem Relation Age of Onset   Cancer Father        lung   Pneumonia Father    Lung cancer Father    Hypertension Mother    Heart disease Mother    Seizures Mother    Healthy Sister    Healthy Brother    Cancer Maternal Grandmother    Heart disease Paternal Grandfather    Healthy Brother     Social History:  Social History[2]  ROS: Constitutional:  Negative for fever, chills, weight loss CV: Negative for chest pain, previous MI, hypertension Respiratory:  Negative for shortness of breath, wheezing, sleep apnea, frequent cough GI:  Negative for nausea, vomiting, bloody stool, GERD  Physical exam: BP 136/83   Pulse 90  GENERAL APPEARANCE:  Well appearing, well developed, well nourished, NAD HEENT:  Atraumatic, normocephalic, oropharynx clear NECK:  Supple without lymphadenopathy or thyromegaly ABDOMEN:  Soft, non-tender, no masses EXTREMITIES:  Moves all extremities well, without clubbing, cyanosis, or edema NEUROLOGIC:  Alert and oriented x 3, normal gait, CN II-XII grossly intact MENTAL STATUS:  appropriate BACK:  Non-tender to palpation, No CVAT SKIN:  Warm, dry, and intact  Results: U/A: 0-5 WBCs, 0-2 RBCs     [1]  Allergies Allergen Reactions   Penicillins Hives, Nausea And Vomiting and Swelling  [2]  Social History Tobacco Use   Smoking status: Never   Smokeless tobacco: Never  Substance Use Topics   Alcohol use: Never    Drug use: Never   "

## 2024-06-24 ENCOUNTER — Other Ambulatory Visit: Payer: Self-pay | Admitting: Family Medicine

## 2024-06-24 DIAGNOSIS — R931 Abnormal findings on diagnostic imaging of heart and coronary circulation: Secondary | ICD-10-CM

## 2024-06-24 DIAGNOSIS — E782 Mixed hyperlipidemia: Secondary | ICD-10-CM

## 2024-07-04 ENCOUNTER — Ambulatory Visit: Admitting: Family Medicine

## 2024-07-05 ENCOUNTER — Ambulatory Visit: Admitting: Urology

## 2024-07-05 ENCOUNTER — Encounter: Payer: Self-pay | Admitting: Urology

## 2024-07-05 VITALS — BP 136/82 | HR 75 | Ht 70.0 in | Wt 169.0 lb

## 2024-07-05 DIAGNOSIS — N201 Calculus of ureter: Secondary | ICD-10-CM | POA: Diagnosis not present

## 2024-07-05 LAB — URINALYSIS, ROUTINE W REFLEX MICROSCOPIC
Bilirubin, UA: NEGATIVE
Glucose, UA: NEGATIVE
Ketones, UA: NEGATIVE
Leukocytes,UA: NEGATIVE
Nitrite, UA: NEGATIVE
Protein,UA: NEGATIVE
RBC, UA: NEGATIVE
Urobilinogen, Ur: 0.2 mg/dL (ref 0.2–1.0)
pH, UA: 6 (ref 5.0–7.5)

## 2024-07-05 NOTE — Addendum Note (Signed)
 Addended by: OBADIAH ROSELEE RAMAN on: 07/05/2024 02:05 PM   Modules accepted: Orders

## 2024-07-05 NOTE — Progress Notes (Signed)
 " Assessment: 1. Ureteral calculus; right, distal, 2 mm     Plan: Stone sent for analysis. Stone prevention discussed and information provided. Return to office as needed.  Chief Complaint:  Chief Complaint  Patient presents with   Nephrolithiasis    History of Present Illness:  Willie [name] is a 64 y.o. male who is seen for further evaluation of right ureteral calculus. He presented to urgent care on 06/11/2024 with pain in the right flank with radiation to the right mid abdomen.  He also had some associated nausea without vomiting.  No history of kidney stones. Dipstick urinalysis showed moderate blood.  No imaging performed. He was given pain medication and tamsulosin .  He continued to have pain in the right flank and back area with severity of 7/10.  No fevers.  He has not had any significant lower urinary tract symptoms.  No dysuria or gross hematuria. CT renal stone study from 06/13/2024 showed a 2 mm calculus in the right distal ureter with mild right sided hydronephrosis. He continued on tamsulosin  and pain medication as needed. At his visit on 06/22/2024, he continued to have some intermittent discomfort in the right flank area.  He had taken oxycodone once or twice a day for management.  He was straining his urine but was not aware of passing a stone. No dysuria or gross hematuria.  He returns today for follow-up.  He passed a stone approximately 4 days ago.  His symptoms have completely resolved.  No flank pain.  Portions of the above documentation were copied from a prior visit for review purposes only.  Past Medical History:  Past Medical History:  Diagnosis Date   Allergy    seasonal   Dyslipidemia    GERD (gastroesophageal reflux disease)    Hypertension    dr joylene   Umbilical hernia     Past Surgical History:  Past Surgical History:  Procedure Laterality Date   HERNIA REPAIR  01/2002   UMBILICAL HERNIA REPAIR  09/01/2011   Procedure: HERNIA REPAIR  UMBILICAL ADULT;  Surgeon: Vicenta DELENA Poli, MD;  Location: [facility] OR;  Service: General;  Laterality: N/A;    Allergies:  Allergies[1]  Family History:  Family History  Problem Relation Age of Onset   Cancer Father        lung   Pneumonia Father    Lung cancer Father    Hypertension Mother    Heart disease Mother    Seizures Mother    Healthy Sister    Healthy Brother    Cancer Maternal Grandmother    Heart disease Paternal Grandfather    Healthy Brother     Social History:  Social History[2]  ROS: Constitutional:  Negative for fever, chills, weight loss CV: Negative for chest pain, previous MI, hypertension Respiratory:  Negative for shortness of breath, wheezing, sleep apnea, frequent cough GI:  Negative for nausea, vomiting, bloody stool, GERD  Physical exam: BP 136/82   Pulse 75   Ht 5' 10 (1.778 m)   Wt 169 lb (76.7 kg)   BMI 24.25 kg/m  GENERAL APPEARANCE:  Well appearing, well developed, well nourished, NAD HEENT:  Atraumatic, normocephalic, oropharynx clear NECK:  Supple without lymphadenopathy or thyromegaly ABDOMEN:  Soft, non-tender, no masses EXTREMITIES:  Moves all extremities well, without clubbing, cyanosis, or edema NEUROLOGIC:  Alert and oriented x 3, normal gait, CN II-XII grossly intact MENTAL STATUS:  appropriate BACK:  Non-tender to palpation, No CVAT SKIN:  Warm, dry, and intact  Results: U/A: Negative     [1]  Allergies Allergen Reactions   Penicillins Hives, Nausea And Vomiting and Swelling  [2]  Social History Tobacco Use   Smoking status: Never   Smokeless tobacco: Never  Substance Use Topics   Alcohol use: Never   Drug use: Never   "

## 2024-07-06 ENCOUNTER — Ambulatory Visit: Admitting: Urology

## 2024-07-25 ENCOUNTER — Ambulatory Visit: Admitting: Family Medicine
